# Patient Record
Sex: Female | Born: 1945 | Race: White | Hispanic: No | Marital: Single | Smoking: Former smoker
Health system: Southern US, Academic
[De-identification: ages and names within clinical notes are randomized; demographics above are authoritative.]

## PROBLEM LIST (undated history)

## (undated) DIAGNOSIS — I10 Essential (primary) hypertension: Secondary | ICD-10-CM

## (undated) DIAGNOSIS — E119 Type 2 diabetes mellitus without complications: Secondary | ICD-10-CM

## (undated) DIAGNOSIS — I219 Acute myocardial infarction, unspecified: Secondary | ICD-10-CM

## (undated) DIAGNOSIS — N390 Urinary tract infection, site not specified: Secondary | ICD-10-CM

## (undated) DIAGNOSIS — Z973 Presence of spectacles and contact lenses: Secondary | ICD-10-CM

## (undated) DIAGNOSIS — M109 Gout, unspecified: Secondary | ICD-10-CM

## (undated) DIAGNOSIS — R6889 Other general symptoms and signs: Secondary | ICD-10-CM

## (undated) DIAGNOSIS — R21 Rash and other nonspecific skin eruption: Secondary | ICD-10-CM

## (undated) HISTORY — PX: CAROTID ENDARTERECTOMY: SUR193

## (undated) HISTORY — DX: Gout, unspecified: M10.9

## (undated) HISTORY — PX: HX HYSTERECTOMY: SHX81

## (undated) HISTORY — PX: HX CAROTID ENDARTERECTOMY: SHX38

## (undated) HISTORY — DX: Essential (primary) hypertension: I10

## (undated) HISTORY — PX: HX GALL BLADDER SURGERY/CHOLE: SHX55

## (undated) HISTORY — PX: COLONOSCOPY W/ POLYPECTOMY: SHX1380

## (undated) HISTORY — PX: PARS PLANA VITRECTOMY W/ REPAIR OF MACULAR HOLE: SHX2170

## (undated) HISTORY — DX: Type 2 diabetes mellitus without complications: E11.9

## (undated) HISTORY — PX: AMPUTATION OF REPLICATED TOES: SHX1136

## (undated) HISTORY — PX: HX CATARACT REMOVAL: SHX102

## (undated) HISTORY — PX: HX HERNIA REPAIR: SHX51

## (undated) HISTORY — PX: LUNG REMOVAL, PARTIAL: SHX233

## (undated) HISTORY — PX: HX BREAST BIOPSY: SHX20

---

## 1991-09-11 ENCOUNTER — Ambulatory Visit (HOSPITAL_COMMUNITY): Payer: Self-pay | Admitting: EXTERNAL

## 2019-07-02 ENCOUNTER — Inpatient Hospital Stay (HOSPITAL_COMMUNITY)
Admission: EM | Admit: 2019-07-02 | Discharge: 2019-07-02 | Disposition: A | Discharge status: Home / Self Care | Payer: Medicare HMO | Source: Other Acute Inpatient Hospital

## 2019-07-02 DIAGNOSIS — N3000 Acute cystitis without hematuria: Secondary | ICD-10-CM

## 2019-07-02 DIAGNOSIS — G9341 Metabolic encephalopathy: Secondary | ICD-10-CM

## 2019-07-02 DIAGNOSIS — A419 Sepsis, unspecified organism: Secondary | ICD-10-CM

## 2019-07-02 DIAGNOSIS — I214 Non-ST elevation (NSTEMI) myocardial infarction: Secondary | ICD-10-CM

## 2019-07-03 DIAGNOSIS — J181 Lobar pneumonia, unspecified organism: Secondary | ICD-10-CM

## 2019-07-03 DIAGNOSIS — N390 Urinary tract infection, site not specified: Secondary | ICD-10-CM

## 2021-03-14 LAB — LAB: COLOGUARD RESULT: NEGATIVE

## 2021-03-14 LAB — COLOGUARD® COLON CANCER SCREEN: COLOGUARD RESULT: NEGATIVE

## 2021-07-21 ENCOUNTER — Encounter (INDEPENDENT_AMBULATORY_CARE_PROVIDER_SITE_OTHER): Payer: Self-pay | Admitting: Internal Medicine

## 2021-08-04 ENCOUNTER — Ambulatory Visit (INDEPENDENT_AMBULATORY_CARE_PROVIDER_SITE_OTHER): Payer: Medicare HMO | Admitting: Foot & Ankle Surgery

## 2021-08-04 ENCOUNTER — Other Ambulatory Visit: Payer: Self-pay

## 2021-08-04 ENCOUNTER — Encounter (INDEPENDENT_AMBULATORY_CARE_PROVIDER_SITE_OTHER): Payer: Self-pay | Admitting: Foot & Ankle Surgery

## 2021-08-04 DIAGNOSIS — E1149 Type 2 diabetes mellitus with other diabetic neurological complication: Secondary | ICD-10-CM

## 2021-08-04 DIAGNOSIS — M2041 Other hammer toe(s) (acquired), right foot: Secondary | ICD-10-CM

## 2021-08-04 DIAGNOSIS — Z794 Long term (current) use of insulin: Secondary | ICD-10-CM

## 2021-08-25 ENCOUNTER — Other Ambulatory Visit: Payer: Self-pay

## 2021-08-25 ENCOUNTER — Encounter (INDEPENDENT_AMBULATORY_CARE_PROVIDER_SITE_OTHER): Payer: Self-pay | Admitting: Foot & Ankle Surgery

## 2021-08-25 ENCOUNTER — Ambulatory Visit (INDEPENDENT_AMBULATORY_CARE_PROVIDER_SITE_OTHER): Payer: Medicare HMO | Admitting: Foot & Ankle Surgery

## 2021-08-25 DIAGNOSIS — E1149 Type 2 diabetes mellitus with other diabetic neurological complication: Secondary | ICD-10-CM

## 2021-08-25 DIAGNOSIS — M2041 Other hammer toe(s) (acquired), right foot: Secondary | ICD-10-CM

## 2021-08-25 NOTE — Progress Notes (Signed)
[name], ST. [facility]  [phone] [address]. [place] [address]            Name: Molly Howell MRN:  [id number]   Date: 08/25/2021 Age: 76 y.o.         Chief Complaint:   Chief Complaint   Patient presents with   . Foot Pain     Follow-up right foot-hammertoe       Subjective  Molly Howell is a 76 y.o. female who presents to clinic for follow-up of hammertoe.  She is been wearing padding which she says helps.  She relates no redness no drainage      Objective    On physical examination Molly Howell is seated comfortably in the examination room in no apparent distress.  Alert and oriented to time and place. Mood and affect are normal and appropriate to situation.  She  appears well developed, well nourished, with good attention to hygiene and body habitus.    Skin:  Thin callus dorsal aspect of the right 5th toe.  No ulcer bleed no erythema no drainage.  Musculoskeletal:  Toes 2, 3, 4, 5 on the right contracted at the PIPJ, rigid  Neurological:  Diminished response to pain stimuli right foot  Vascular:  Pedal pulses are intact right.  CFT is immediate to all toes.  Skin temperature is warm to warm proximal to distal right.  No edema right foot or ankle      Assessment/ Plan  Molly Howell was seen today for foot pain.    Diagnoses and all orders for this visit:    Hammer toe of right foot    Type II or unspecified type diabetes mellitus with neurological manifestations, uncontrolled(250.62) (CMS HCC)        1. Today I explained the etiology, prognosis, and treatment options for hammertoes with preulcerative callus right foot.    2. I discussed treatment options including offloading, padding, callus debridement.    3. Today I recommended continue with Silipos padding.  More Silipos padding dispensed today.  Consider radiographs if needed.  The callus today is too thin for debridement.  She recently received diabetic shoes and will likely not be eligible for new shoes until next year.   Consider another order for her.  4. Follow up in 6 or 8 weeks sooner if needed.      Wyn Quaker, DPM        This note was partially created using M*Modal fluency direct system (voice recognition software ) and is inherently subject to errors including those of syntax and "sound- alike" substitutions which may escape proofreading.  In such instances, , original meaning may be extrapolated by contextual derivation.

## 2021-09-21 ENCOUNTER — Other Ambulatory Visit: Payer: Self-pay

## 2021-09-21 ENCOUNTER — Ambulatory Visit (INDEPENDENT_AMBULATORY_CARE_PROVIDER_SITE_OTHER): Payer: Medicare HMO | Admitting: Foot & Ankle Surgery

## 2021-09-21 ENCOUNTER — Encounter (INDEPENDENT_AMBULATORY_CARE_PROVIDER_SITE_OTHER): Payer: Self-pay | Admitting: Foot & Ankle Surgery

## 2021-09-21 DIAGNOSIS — E1149 Type 2 diabetes mellitus with other diabetic neurological complication: Secondary | ICD-10-CM

## 2021-09-21 DIAGNOSIS — M2041 Other hammer toe(s) (acquired), right foot: Secondary | ICD-10-CM

## 2021-09-21 DIAGNOSIS — L97511 Non-pressure chronic ulcer of other part of right foot limited to breakdown of skin: Secondary | ICD-10-CM

## 2021-09-21 NOTE — Progress Notes (Signed)
Molly Howell, ST. [facility]  [phone]. [place] [address]            Name: Molly Howell MRN:  [id number]   Date: 09/21/2021 Age: 76 y.o.         Chief Complaint:   Chief Complaint   Patient presents with    Toe Pain     Follow up wound on small toe R foot        Subjective  Molly Howell is a 76 y.o. female who presents to clinic for follow-up of lesion on her right 5th toe.  The area opened up again.  She is been using a Band-Aid.  He has been wearing diabetic shoes.  She relates no pain      Objective    On physical examination Molly Howell is seated comfortably in the examination room in no apparent distress.  Alert and oriented to time and place. Mood and affect are normal and appropriate to situation.  She  appears well developed, well nourished, with good attention to hygiene and body habitus.    Skin:  Ulceration dorsal aspect of the PIPJ of the right 5th toe.  Callus at the wound periphery.  After debridement the wound measures 0.4 cm in diameter with a 0.1 cm probe depth.  No probe to bone no tracking no undermining no erythema no calor no crepitus no malodor no drainage  Musculoskeletal:  Right 5th toe contracted at the PIPJ, rigid  Neurological:  She does not respond to pain stimuli right foot  Vascular:  Skin temperature is warm to warm proximal to distal right.  CFT is immediate to all toes      Assessment/ Plan  Molly Howell was seen today for toe pain.    Diagnoses and all orders for this visit:    Skin ulcer of toe of right foot, limited to breakdown of skin (CMS HCC)  -     XR TOE, 5TH DIGIT RIGHT; Future    Hammer toe of right foot  -     XR TOE, 5TH DIGIT RIGHT; Future    Type II or unspecified type diabetes mellitus with neurological manifestations, uncontrolled(250.62) (CMS HCC)        1. Today I explained the etiology, prognosis, and treatment options for ulceration right 5th toe, hammertoe deformity.    2. I discussed treatment options including  offloading, wound care.    3. Today I recommended debridement of callus.  She is in agreement.  I removed callus from the wound periphery using a 15. Blade.  Applied a Band-Aid.  Recommend cleaning the wound with warm soapy water daily apply TheraHoney gel and Band-Aid to cover.  She responded well in the past to tube foam padding.  Once the wound is improved we will resort to either foam or Silipos padding for the 5th toe.  Continue with diabetic shoes for now.  I have ordered radiographs of the 5th toe..  4. Follow up in 4 weeks.      Wyn Quaker, DPM        This note was partially created using M*Modal fluency direct system (voice recognition software ) and is inherently subject to errors including those of syntax and "sound- alike" substitutions which may escape proofreading.  In such instances, , original meaning may be extrapolated by contextual derivation.

## 2021-10-05 ENCOUNTER — Ambulatory Visit (INDEPENDENT_AMBULATORY_CARE_PROVIDER_SITE_OTHER): Payer: Self-pay | Admitting: Internal Medicine

## 2021-10-08 ENCOUNTER — Other Ambulatory Visit: Payer: Self-pay

## 2021-10-08 ENCOUNTER — Ambulatory Visit: Payer: Medicare HMO | Attending: Internal Medicine | Admitting: Internal Medicine

## 2021-10-08 ENCOUNTER — Encounter (INDEPENDENT_AMBULATORY_CARE_PROVIDER_SITE_OTHER): Payer: Self-pay | Admitting: Internal Medicine

## 2021-10-08 VITALS — BP 110/50 | HR 77 | Resp 12 | Ht 63.0 in | Wt 137.0 lb

## 2021-10-08 DIAGNOSIS — E1149 Type 2 diabetes mellitus with other diabetic neurological complication: Secondary | ICD-10-CM | POA: Insufficient documentation

## 2021-10-08 DIAGNOSIS — Z794 Long term (current) use of insulin: Secondary | ICD-10-CM | POA: Insufficient documentation

## 2021-10-08 DIAGNOSIS — Z1239 Encounter for other screening for malignant neoplasm of breast: Secondary | ICD-10-CM

## 2021-10-08 DIAGNOSIS — Z87891 Personal history of nicotine dependence: Secondary | ICD-10-CM

## 2021-10-08 DIAGNOSIS — M109 Gout, unspecified: Secondary | ICD-10-CM | POA: Insufficient documentation

## 2021-10-08 DIAGNOSIS — Z0000 Encounter for general adult medical examination without abnormal findings: Secondary | ICD-10-CM | POA: Insufficient documentation

## 2021-10-08 DIAGNOSIS — E559 Vitamin D deficiency, unspecified: Secondary | ICD-10-CM | POA: Insufficient documentation

## 2021-10-08 DIAGNOSIS — E1140 Type 2 diabetes mellitus with diabetic neuropathy, unspecified: Secondary | ICD-10-CM | POA: Insufficient documentation

## 2021-10-08 DIAGNOSIS — I10 Essential (primary) hypertension: Secondary | ICD-10-CM | POA: Insufficient documentation

## 2021-10-08 DIAGNOSIS — I2510 Atherosclerotic heart disease of native coronary artery without angina pectoris: Secondary | ICD-10-CM | POA: Insufficient documentation

## 2021-10-08 DIAGNOSIS — E119 Type 2 diabetes mellitus without complications: Secondary | ICD-10-CM | POA: Insufficient documentation

## 2021-10-08 MED ORDER — COLCHICINE 0.6 MG TABLET
0.6000 mg | ORAL_TABLET | Freq: Every day | ORAL | 0 refills | Status: DC
Start: 2021-10-08 — End: 2022-02-14

## 2021-10-08 MED ORDER — OMEPRAZOLE 40 MG CAPSULE,DELAYED RELEASE
40.0000 mg | DELAYED_RELEASE_CAPSULE | Freq: Every day | ORAL | 1 refills | Status: DC
Start: 2021-10-08 — End: 2022-02-01

## 2021-10-08 NOTE — Progress Notes (Signed)
FAMILY MEDICINE, [facility] [address]  [place] [address]  Phone: [phone]  Fax: [phone]    Encounter Date: 10/08/2021    Patient ID:  Molly Howell  PJS:[id number]    DOB: 04/22/1945  Age: 76 y.o. female    There are no exam notes on file for this visit.  Subjective:     Chief Complaint   Patient presents with    New Patient     Moved from [place].  No new concerns.  Gout right hand.    Medicare Annual     Pt here to estab as a new pt.  She is a Information systems manager well.  She has h/o DM since age 68.  She does have neuropathy.  She initially was placed on metformin but stopped due to diarrhea.  She currently is on toujeo.  Last A1C 8.2 about 3 months ago.  She does see podiatry.  She had an eye exam in May 2023.  She had colonoscopy 3-4 years ago and it was normal. Last mammogram about 1.5 years ago.  Unsure last DEXA, maybe one year ago    Hypertension-   Controlled-yes   Checking BP at home-yes    Gout-she has it in her MCP joints of right hand.  On colchicine during flair and it helps.  She needs a refill.  She takes allopurinol daily        Current Outpatient Medications   Medication Sig    allopurinoL (ZYLOPRIM) 100 mg Oral Tablet Take 1 Tablet (100 mg total) by mouth Once a day    Apple Cider Vinegar 300 mg Oral Tablet Take by mouth    aspirin 325 mg Oral Tablet Take 1 Tablet (325 mg total) by mouth Once a day    atenoloL (TENORMIN) 25 mg Oral Tablet Take 1 Tablet (25 mg total) by mouth Once a day    cholecalciferol, vitamin D3, 25 mcg (1,000 unit) Oral Tablet Take 1 Tablet (1,000 Units total) by mouth Once a day    colchicine, gout, 0.6 mg Oral Tablet Take 1 Tablet (0.6 mg total) by mouth Once a day    cyanocobalamin, vitamin B-12, 5,000 mcg Sublingual Tablet, Sublingual Place under the tongue    insulin glargine U-300 conc (TOUJEO MAX U-300 SOLOSTAR) 300 unit/mL (3 mL) Subcutaneous Insulin Pen Inject 5 Units under the skin Once a day    insulin lispro 100 units/mL Subcutaneous  Injectable Inject 0-12 Units under the skin 5 units at breakfast  10 units at lunch  5 units at dinner    isosorbide mononitrate (IMDUR) 30 mg Oral Tablet Sustained Release 24 hr Take 1 Tablet (30 mg total) by mouth Every morning    lisinopriL (PRINIVIL) 20 mg Oral Tablet Take 1 Tablet (20 mg total) by mouth Once a day    magnesium oxide (MAG-OX) 400 mg Oral Tablet Take 1 Tablet (400 mg total) by mouth Once a day    multivit-minerals/folic acid (MULTIVITAMIN GUMMIES ORAL) Take 1 Tablet by mouth Once a day    omeprazole (PRILOSEC) 40 mg Oral Capsule, Delayed Release(E.C.) Take 1 Capsule (40 mg total) by mouth Once a day    pregabalin (LYRICA) 150 mg Oral Capsule Take 1 Capsule (150 mg total) by mouth Twice daily    rosuvastatin (CRESTOR) 10 mg Oral Tablet Take 1 Tablet (10 mg total) by mouth Every evening    traMADoL (ULTRAM) 50 mg Oral Tablet Take by mouth Every 6 hours as needed for Pain  No Known Allergies  Past Medical History:   Diagnosis Date    Diabetes mellitus, type 2 (CMS HCC)     Gout, unspecified     Hypertension          Social History     Tobacco Use    Smoking status: Former     Packs/day: 1.00     Types: Cigarettes    Smokeless tobacco: Never   Vaping Use    Vaping Use: Never used   Substance Use Topics    Alcohol use: Never    Drug use: Never         Review of Systems   Musculoskeletal:  Positive for arthralgias.   All other systems reviewed and are negative.  Objective:    Vitals: BP (!) 110/50 (Site: Left, Patient Position: Sitting, Cuff Size: Adult)   Pulse 77   Resp 12   Ht 1.6 m ('5\' 3"' )   Wt 62.1 kg (137 lb)   BMI 24.27 kg/m           Physical Exam  Vitals and nursing note reviewed.   Constitutional:       General: She is not in acute distress.  HENT:      Head: Normocephalic.      Right Ear: Tympanic membrane normal.      Left Ear: Tympanic membrane normal.      Nose: Nose normal.      Mouth/Throat:      Pharynx: Oropharynx is clear.      Comments: Upper denture, lower  adentulous  Eyes:      Pupils: Pupils are equal, round, and reactive to light.      Comments: glasses   Cardiovascular:      Rate and Rhythm: Normal rate and regular rhythm.      Pulses: Normal pulses.   Pulmonary:      Effort: Pulmonary effort is normal.      Breath sounds: Normal breath sounds.   Abdominal:      General: Bowel sounds are normal.   Musculoskeletal:      Comments: Left foot-amputation mid foot  Right great toe amputated  Decreased sensation b/l feet   Skin:     General: Skin is warm and dry.   Neurological:      General: No focal deficit present.      Mental Status: She is alert and oriented to person, place, and time.   Psychiatric:         Mood and Affect: Mood normal.       No results found for this or any previous visit (from the past 9233 hour(s)).    Assessment & Plan:     ENCOUNTER DIAGNOSES     ICD-10-CM   1. Medicare annual wellness visit, subsequent  Z00.00   2. Hypertension, unspecified type  I10   3. Type 2 diabetes mellitus with diabetic neuropathy, with long-term current use of insulin (CMS HCC)  E11.40    Z79.4   4. Gout, unspecified cause, unspecified chronicity, unspecified site  M10.9   5. Coronary artery disease involving native coronary artery of native heart without angina pectoris  I25.10   6. Vitamin D deficiency  E55.9   7. Encounter for screening for malignant neoplasm of breast, unspecified screening modality  Z12.39      Continue allopurinol and colchicine as needed for gout  Continue toujeo and lispro for DM  Continue lisinopril for HTN and renal protection  Continue atenolol for HTN  Continue Imdur for CAD  Continue vitamin D and check level for D def  Continue Lyrica, tramadol BID for DM neuropathy  Mammogram ordered  Continue Crestor for lipids  Labs as ordered  Orders Placed This Encounter    MAMMO BILATERAL SCREENING-ADDL VIEWS/BREAST US AS REQ BY RAD    COMPREHENSIVE METABOLIC PNL, FASTING    LIPID PANEL    MICROALBUMIN/CREATININE RATIO, URINE, RANDOM    URINALYSIS  WITH REFLEX MICROSCOPIC AND CULTURE IF POSITIVE    HGA1C (HEMOGLOBIN A1C WITH EST AVG GLUCOSE)    VITAMIN D 25 TOTAL    URIC ACID    Referral to OPHTHALMOLOGY-[facility]    omeprazole (PRILOSEC) 40 mg Oral Capsule, Delayed Release(E.C.)    colchicine, gout, 0.6 mg Oral Tablet                    Return in about 3 months (around 01/07/2022) for In Person Visit.      Serita Grammes, DO  10/08/2021, 11:46FAMILY MEDICINE, [address]  [place] [id number]  Operated by [facility]  Medicare Annual Wellness Visit    Name: Molly Howell MRN:  [id number]   Date: 10/08/2021 Age: 76 y.o.       SUBJECTIVE:   Morrison Masser is a 76 y.o. female for presenting for Medicare Wellness exam.   I have reviewed and reconciled the medication list with the patient today.        10/08/2021    11:38 AM   Comprehensive Health Assessment-Adult   Do you wish to complete this form? Yes   During the past 4 weeks, how would you rate your health in general? Good   During the past 4 weeks, how much difficulty have you had doing your usual activities inside and outside your home because of medical or emotional problems? No difficulty at all   During the past 4 weeks, was someone available to help you if you needed and wanted help? Yes, as much as I wanted   In the past year, how many times have you gone to the emergency department or been admitted to a hospital for a health problem? None   Are you generally satisfied with your sleep? Yes   Do you have enough money to buy things you need in everyday life, such as food, clothing, medicines, and housing? Yes, always   Can you get to places beyond walking distance without help?  (For example, can you drive your own car or travel alone on buses)? Yes   Do you fasten your seatbelt when you are in a car? Yes, usually   Do you exercise 20 minutes 3 or more days per week (such as walking, dancing, biking, mowing grass, swimming)? No, I usually don't  exercise this much   How often do you eat food that is healthy (fruits, vegetables, lean meats) instead of unhealthy (sweets, fast food, junk food, fatty foods)? Some of the time   Have your parents, brothers or sisters had any of the following problems before the age of 69? (check all that apply) Heart problems, or hardening of the arteries;Diabetes (sugar);High cholesterol;Mental health problems such as depression, bipolar, severe anxiety, postpartum depression;Alcohol or drug addiction (or abuse)   How often do you have trouble taking medicines the eay you are told to take them? I always take them as prescribed   Do you need any help communicating with your doctors and nurses because of vision or  hearing problems? No   During the past 12 months, have you experienced confusion or memory loss that is happening more often or is getting worse? No   Do you have one person you think of as your personal doctor (primary care provider or family doctor)? Yes   If you are seeing a Primary Care Provider (PCP) or family doctor. please list their name Dr. Wynne Dust DO   How confident are you that you can control or manage most of your health problems? Very confident         I have reviewed and updated as appropriate the past medical, family and social history. 10/08/2021 as summarized below:  Past Medical History:   Diagnosis Date    Diabetes mellitus, type 2 (CMS HCC)     Gout, unspecified     Hypertension      Past Surgical History:   Procedure Laterality Date    Amputation of replicated toes      Carotid endarterectomy      Hx cataract removal      Hx cholecystectomy      Hx hernia repair      Hx hysterectomy       Current Outpatient Medications   Medication Sig    allopurinoL (ZYLOPRIM) 100 mg Oral Tablet Take 1 Tablet (100 mg total) by mouth Once a day    Apple Cider Vinegar 300 mg Oral Tablet Take by mouth    aspirin 325 mg Oral Tablet Take 1 Tablet (325 mg total) by mouth Once a day    atenoloL (TENORMIN) 25 mg Oral  Tablet Take 1 Tablet (25 mg total) by mouth Once a day    cholecalciferol, vitamin D3, 25 mcg (1,000 unit) Oral Tablet Take 1 Tablet (1,000 Units total) by mouth Once a day    colchicine, gout, 0.6 mg Oral Tablet Take 1 Tablet (0.6 mg total) by mouth Once a day    cyanocobalamin, vitamin B-12, 5,000 mcg Sublingual Tablet, Sublingual Place under the tongue    insulin glargine U-300 conc (TOUJEO MAX U-300 SOLOSTAR) 300 unit/mL (3 mL) Subcutaneous Insulin Pen Inject 5 Units under the skin Once a day    insulin lispro 100 units/mL Subcutaneous Injectable Inject 0-12 Units under the skin 5 units at breakfast  10 units at lunch  5 units at dinner    isosorbide mononitrate (IMDUR) 30 mg Oral Tablet Sustained Release 24 hr Take 1 Tablet (30 mg total) by mouth Every morning    lisinopriL (PRINIVIL) 20 mg Oral Tablet Take 1 Tablet (20 mg total) by mouth Once a day    magnesium oxide (MAG-OX) 400 mg Oral Tablet Take 1 Tablet (400 mg total) by mouth Once a day    multivit-minerals/folic acid (MULTIVITAMIN GUMMIES ORAL) Take 1 Tablet by mouth Once a day    omeprazole (PRILOSEC) 40 mg Oral Capsule, Delayed Release(E.C.) Take 1 Capsule (40 mg total) by mouth Once a day    pregabalin (LYRICA) 150 mg Oral Capsule Take 1 Capsule (150 mg total) by mouth Twice daily    rosuvastatin (CRESTOR) 10 mg Oral Tablet Take 1 Tablet (10 mg total) by mouth Every evening    traMADoL (ULTRAM) 50 mg Oral Tablet Take by mouth Every 6 hours as needed for Pain     Family Medical History:       Problem Relation (Age of Onset)    Coronary Artery Disease Mother    Diabetes Mother    Hypertension (High Blood Pressure) Mother  Lung disease Father            Social History     Socioeconomic History    Marital status: Divorced   Tobacco Use    Smoking status: Former     Packs/day: 1.00     Types: Cigarettes    Smokeless tobacco: Never   Vaping Use    Vaping Use: Never used   Substance and Sexual Activity    Alcohol use: Never    Drug use: Never     Social  Determinants of Company secretary Strain: Low Risk     SDOH Financial: No   Transportation Needs: Low Risk     SDOH Transportation: No   Social Connections: Low Risk     SDOH Social Isolation: 5 or more times a week   Intimate Partner Violence: Low Risk     SDOH Domestic Violence: No   Housing Stability: [place] Situation: I have housing.    SDOH Housing Worry: No   Health Literacy: Low Risk     SDOH Health Literacy: Never   Employment Status: Low Risk     SDOH Employment: Otherwise unemployed but not seeking work (ex. Ship broker, retired, disabled, unpaid primary care giver)         List of [place] Team       PCP       Name Type Specialty Phone Number    Serita Grammes, DO Physician INTERNAL MEDICINE [phone]              Care Team       No care team found                      Health Maintenance   Topic Date Due    Diabetic Retinal Exam  Never done    Osteoporosis screening  Never done    Hepatitis C screening  Never done    Diabetic A1C  Never done    Covid-19 Vaccine (1) Never done    Pneumococcal Vaccination, Age 35+ (1 - PCV) Never done    Diabetic Kidney Health eGFR  Never done    Diabetic Kidney Health Microalb/Cr Ratio  Never done    Adult Tdap-Td (1 - Tdap) Never done    Shingles Vaccine (1 of 2) Never done    Influenza Vaccine (1) Never done    Depression Screening  10/09/2022    Annual Wellness Visit  10/09/2022    Meningococcal Vaccine  Aged Out     Medicare Wellness Assessment   Medicare initial or wellness physical in the last year?: No  Advance Directives   Does patient have a living will or MPOA: no   Has patient provided [employer] with a copy?: no   Advance directive information given to the patient today?: no      Activities of Daily Living   Do you need help with dressing, bathing, or walking?: No   Do you need help with shopping, housekeeping, medications, or finances?: No   Do you have rugs in hallways, broken steps, or poor  lighting?: No   Do you have grab bars in your bathroom, non-slip strips in your tub, and hand rails on your stairs?: Yes   Urinary Incontinence Screen   Do you ever leak urine when you don't want to?: YES   Cognitive Function Screen (1=Yes, 0=No)   What  is you age?: Correct   What is the time to the nearest hour?: Correct   What is the year?: Correct   What is the name of this clinic?: Correct   Can the patient recognize two persons (the doctor, the nurse, home help, etc.)?: Correct   What is the date of your birth? (day and month sufficient) : Correct   In what year did World War II end?: Correct   Who is the current president of the [place]?: Correct   Count from 20 down to 1?: Correct   What address did I give you earlier?: Incorrect   Total Score: 9   Interpretation of Total Score: Greater than 6 Normal   Hearing Screen   Have you noticed any hearing difficulties?: No   Fall Risk Screen   Do you feel unsteady when standing or walking?: Yes  Do you worry about falling?: Yes  Have you fallen in the past year?: Yes  How many times have you fallen?: Once  Were you ever injured from falling?: No   Vision Screen   Right Eye = 20: 25   Left Eye = 20: 25   Depression Screen     Little interest or pleasure in doing things.: Not at all  Feeling down, depressed, or hopeless: Not at all  PHQ 2 Total: 0     Pain Score   Pain Score:   0 - No pain    Substance Use-Abuse Screening     Tobacco Use     In Past 12 MONTHS, how often have you used any tobacco product (for example, cigarettes, e-cigarettes, cigars, pipes, or smokeless tobacco)?: Never     Alcohol use     In the PAST 12 MONTHS, how often have you had 5 (men)/4 (women) or more drinks containing alcohol in one day?: Never     Prescription Drug Use     In the PAST 12 months, how often have you used any prescription medications just for the feeling, more than prescribed, or that were not prescribed for you? Prescriptions may include: opioids, benzodiazepines,  medications for ADHD: Never           Illicit Drug Use   In the PAST 12 MONTHS, how often have you used any drugs, including marijuana, cocaine or crack, heroin, methamphetamine, hallucinogens, ecstasy/MDMA?: Never           OBJECTIVE:   BP (!) 110/50 (Site: Left, Patient Position: Sitting, Cuff Size: Adult)   Pulse 77   Resp 12   Ht 1.6 m ('5\' 3"' )   Wt 62.1 kg (137 lb)   BMI 24.27 kg/m        Other appropriate exam:    Health Maintenance Due   Topic Date Due    Diabetic Retinal Exam  Never done    Osteoporosis screening  Never done    Hepatitis C screening  Never done    Diabetic A1C  Never done    Covid-19 Vaccine (1) Never done    Pneumococcal Vaccination, Age 61+ (1 - PCV) Never done    Diabetic Kidney Health eGFR  Never done    Diabetic Kidney Health Microalb/Cr Ratio  Never done    Adult Tdap-Td (1 - Tdap) Never done    Shingles Vaccine (1 of 2) Never done    Influenza Vaccine (1) Never done      ASSESSMENT & PLAN:   Assessment/Plan   1. Medicare annual wellness visit, subsequent    2.  Hypertension, unspecified type    3. Type 2 diabetes mellitus with diabetic neuropathy, with long-term current use of insulin (CMS HCC)    4. Gout, unspecified cause, unspecified chronicity, unspecified site    5. Coronary artery disease involving native coronary artery of native heart without angina pectoris    6. Vitamin D deficiency    7. Encounter for screening for malignant neoplasm of breast, unspecified screening modality       Identified Risk Factors/ Recommended Actions     Fall Risk Follow up plan of care: Footwear and potential problems addressed  The PHQ 2 Total: 0 depression screen is interpreted as negative.  Urinary Incontinence Plan of Care: Addressed comorbid conditions   Opioid use plan of care:         Opioids use: Plan: Assessment of pain completed and pain controlled        Orders Placed This Encounter    MAMMO BILATERAL SCREENING-ADDL VIEWS/BREAST US AS REQ BY RAD    COMPREHENSIVE METABOLIC PNL,  FASTING    LIPID PANEL    MICROALBUMIN/CREATININE RATIO, URINE, RANDOM    URINALYSIS WITH REFLEX MICROSCOPIC AND CULTURE IF POSITIVE    HGA1C (HEMOGLOBIN A1C WITH EST AVG GLUCOSE)    VITAMIN D 25 TOTAL    URIC ACID    Referral to OPHTHALMOLOGY-[facility]    omeprazole (PRILOSEC) 40 mg Oral Capsule, Delayed Release(E.C.)    colchicine, gout, 0.6 mg Oral Tablet          The patient has been educated about risk factors and recommended preventive care. Written Prevention Plan completed/ updated and given to patient (see After Visit Summary).    Return in about 3 months (around 01/07/2022) for In Person Visit.    Serita Grammes, DO

## 2021-10-08 NOTE — Patient Instructions (Addendum)
Medicare Preventive Services  Medicare coverage information Recommendation for YOU   Heart Disease and Diabetes   Lipid profile Every 5 years or more often if at risk for cardiovascular disease     Diabetes Screening  yearly for those at risk for diabetes, 2 tests per year for those with prediabetes Last Glucose:      Diabetes Self Management Training or Medical Nutrition Therapy  For those with diabetes, up to 10 hrs initial training within a year, subsequent years up to 2 hrs of follow up training Optional for those with diabetes     Medical Nutrition Therapy Three hours of one-on-one counseling in first year, two hours in subsequent years Optional for those with diabetes, kidney disease   Intensive Behavioral Therapy for Obesity  Face-to-face counseling, first month every week, month 2-6 every other week, month 7-12 every month if continued progress is documented Optional for those with Body Mass Index 30 or higher  Your Body mass index is 24.27 kg/m.   Tobacco Cessation (Quitting) Counseling   Two attempts per year, max 4 sessions per attempt, up to 8 per year, for those with tobacco-related health condition Optional for those that use tobacco   Cancer Screening   Colorectal screening   For anyone age 15 to 80 or any age if high risk:  Screening Colonoscopy every 10 yrs if low risk,  more frequent if higher risk  OR  Cologuard Stool DNA test once every 3 years OR  Fecal Occult Blood Testing yearly OR  Flexible  Sigmoidoscopy  every 5 yr OR  CT Colonography every 5 yrs    See your schedule below   Screening Pap Test Recommended every 3 years for all women age 61 to 71, or every five years if combined with HPV test (routine screening not needed after total hysterectomy).  Medicare covers every 2 years, up to yearly if high risk.  Screening Pelvic Exam Medicare covers every 2 years, yearly if high risk or childbearing age with abnormal Pap in last 3 yrs. See your schedule below   Screening Mammogram   Recommended  every 2 years for women age 28 to 13, or more frequent if you have a higher risk. Selectively recommended for women between 40-49 based on shared decisions about risk. Covered by Medicare up to every year for women age 58 or older See your schedule below   Lung Cancer Screening  Annual low dose computed tomography (LDCT scan) is recommended for those age 4-77 who smoked 20 pack-years and are current smokers or quit smoking within past 15 years (one pack-year= smoking one PPD for one year), after counseling by your doctor or nurse clinician about the possible benefits or harms. See your schedule below   Vaccinations   Pneumococcal Vaccine: Recommended routinely age 35+ with one or two separate vaccines based on your risk    Recommended before age 72 if medical conditions with increased risk  Seasonal Influenza Vaccine: Once every flu season   Hepatitis B Vaccine: 3 doses if risk (including anyone with diabetes or liver disease)  Shingles Vaccine: Two doses at age 1 or older  Diphtheria Tetanus Pertussis Vaccine: ONCE as adult, booster every 10 years     There is no immunization history on file for this patient.  Shingles vaccine and Diphtheria Tetanus Pertussis vaccines are available at pharmacies or local health department without a prescription.   Other Screening   Bone Densitometry   Every 24 months for anyone at risk, including  postmenopausal       Glaucoma Screening   Yearly if in high risk group such as diabetes, family history, African American age 21+ or Hispanic American age 78+      Hepatitis C Screening recommended ONCE for those born between 1945-1965, or high risk for HCV infection       HIV Testing recommended routinely at least ONCE, covered every year for age 54 to 58 regardless of risk, and every year for age over 23 who ask for the test or higher risk  Yearly or up to 3 times in pregnancy         Abdominal Aortic Aneurysm Screening Ultrasound   Once between the age of 22-75 with a family history  of AAA       Your Personalized Schedule for Preventive Tests     Health Maintenance: Pending and Last Completed         Date Due Completion Date    Diabetic Retinal Exam Never done ---    Osteoporosis screening Never done ---    Hepatitis C screening Never done ---    Diabetic A1C Never done ---    Covid-19 Vaccine (1) Never done ---    Pneumococcal Vaccination, Age 34+ (1 - PCV) Never done ---    Diabetic Kidney Health eGFR Never done ---    Diabetic Kidney Health Microalb/Cr Ratio Never done ---    Adult Tdap-Td (1 - Tdap) Never done ---    Shingles Vaccine (1 of 2) Never done ---    Annual Wellness Visit Never done ---    Influenza Vaccine (1) Never done ---    Depression Screening 10/09/2022 10/08/2021               Non-Opioid Treatment for Chronic Pains     Treatment for chronic pain can be managed without opioids. Below are non-opioid options that may be considered and discussed with your provider to determine which options would be best for your health.    Over the counter or presciptions medications:  Acetaminophen (Tylenol) or Non-steroidal anti-inflammatories such as: Ibuprofen (Motrin, Advil), naproxen (Aleve), aspirin  Antidepressants such as amitriptyline, nortriptyline (Pamelor),  Doxepin (Silenor), Imipramine (Tofranil) and others.  Anticonvulsant Nerve pain medications: Gabapentin (Neurontin), pregabalin (Lyrica)  Externally applied medications such as NSAID'S, lidocaine, capsaisin, and others  Injections: pain specialists can sometimes inject medications at the site of pain.    Alternative therapies such as  Acupuncture  Osteopathic manipulation  Chiropractic  Massage therapy     If you do not hear back from our office in 7-10 business days regarding your referral please call to check on it   Continue current medications   Labs as ordered  Follow up with specialists as scheduled.

## 2021-10-27 ENCOUNTER — Encounter (INDEPENDENT_AMBULATORY_CARE_PROVIDER_SITE_OTHER): Payer: Self-pay | Admitting: Foot & Ankle Surgery

## 2021-11-02 ENCOUNTER — Encounter (HOSPITAL_COMMUNITY): Payer: Self-pay

## 2021-11-02 ENCOUNTER — Other Ambulatory Visit: Payer: Self-pay

## 2021-11-02 ENCOUNTER — Inpatient Hospital Stay
Admission: RE | Admit: 2021-11-02 | Discharge: 2021-11-02 | Disposition: A | Discharge status: Home / Self Care | Payer: Medicare HMO | Source: Ambulatory Visit | Attending: Internal Medicine | Admitting: Internal Medicine

## 2021-11-02 DIAGNOSIS — Z1231 Encounter for screening mammogram for malignant neoplasm of breast: Secondary | ICD-10-CM | POA: Insufficient documentation

## 2021-11-02 DIAGNOSIS — Z1239 Encounter for other screening for malignant neoplasm of breast: Secondary | ICD-10-CM

## 2021-11-08 ENCOUNTER — Other Ambulatory Visit (INDEPENDENT_AMBULATORY_CARE_PROVIDER_SITE_OTHER): Payer: Self-pay | Admitting: Internal Medicine

## 2021-11-08 MED ORDER — TRAMADOL 50 MG TABLET
1.0000 | ORAL_TABLET | Freq: Four times a day (QID) | ORAL | 1 refills | Status: DC | PRN
Start: 2021-11-08 — End: 2022-01-11

## 2021-11-10 ENCOUNTER — Ambulatory Visit (INDEPENDENT_AMBULATORY_CARE_PROVIDER_SITE_OTHER): Payer: Medicare HMO | Admitting: Foot & Ankle Surgery

## 2021-11-10 ENCOUNTER — Inpatient Hospital Stay (INDEPENDENT_AMBULATORY_CARE_PROVIDER_SITE_OTHER)
Admission: RE | Admit: 2021-11-10 | Discharge: 2021-11-10 | Disposition: A | Discharge status: Home / Self Care | Payer: Medicare HMO | Source: Ambulatory Visit | Attending: Foot & Ankle Surgery | Admitting: Foot & Ankle Surgery

## 2021-11-10 ENCOUNTER — Other Ambulatory Visit: Payer: Self-pay

## 2021-11-10 ENCOUNTER — Encounter (INDEPENDENT_AMBULATORY_CARE_PROVIDER_SITE_OTHER): Payer: Self-pay | Admitting: Foot & Ankle Surgery

## 2021-11-10 DIAGNOSIS — M2041 Other hammer toe(s) (acquired), right foot: Secondary | ICD-10-CM

## 2021-11-10 DIAGNOSIS — L97511 Non-pressure chronic ulcer of other part of right foot limited to breakdown of skin: Secondary | ICD-10-CM

## 2021-11-10 DIAGNOSIS — E1149 Type 2 diabetes mellitus with other diabetic neurological complication: Secondary | ICD-10-CM

## 2021-11-10 DIAGNOSIS — M7989 Other specified soft tissue disorders: Secondary | ICD-10-CM

## 2021-11-10 DIAGNOSIS — R937 Abnormal findings on diagnostic imaging of other parts of musculoskeletal system: Secondary | ICD-10-CM

## 2021-11-10 NOTE — Progress Notes (Signed)
Molly Howell, [place]  [place] [address]            Name: Molly Howell MRN:  [id number]   Date: 11/10/2021 Age: 76 y.o.         Chief Complaint:   Chief Complaint   Patient presents with    Foot Ulcer     Patient is present in office for right foot ulcer.        Subjective  Molly Howell is a 76 y.o. female who presents to clinic for follow-up of lesion on her right 5th toe.  She is been using TheraHoney gel keeping covered with a Band-Aid.  Wearing diabetic shoes.  No pain no redness no drainage no swelling.      Objective    On physical examination Molly Howell is seated comfortably in the examination room in no apparent distress.  Alert and oriented to time and place. Mood and affect are normal and appropriate to situation.  She  appears well developed, well nourished, with good attention to hygiene and body habitus.    Skin:  Ulceration dorsal aspect of the PIPJ of the right 5th toe.  No callus at the wound periphery.  Wound measures 0.3 cm in diameter with no probe depth.  Wound bed is red granulation tissue no erythema no drainage no malodor no calor no crepitus  Musculoskeletal:  Right 5th toe contracted at the PIPJ, rigid  Neurological:  She does not respond to pain stimuli right foot  Vascular:  Skin temperature is warm to warm proximal to distal right.  CFT is immediate to all toes      Assessment/ Plan  Molly Howell was seen today for foot ulcer.    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 Today I explained the etiology, prognosis, and treatment options for ulceration right 5th toe, hammertoe deformity.    2. I discussed treatment options including offloading, wound care.    3. Recommend cleaning the wound with warm soapy water  daily apply TheraHoney gel and Band-Aid to cover.  She responded well in the past to tube foam padding.  Once the wound is resolved we will resort to either foam or Silipos padding for the 5th toe.  Continue with diabetic shoes for now.  Consider radiographs of the 5th toe pending her continued improvement   4. Follow up in 4 to 6 weeks.      Molly Howell, DPM        This note was partially created using M*Modal fluency direct system (voice recognition software ) and is inherently subject to errors including those of syntax and "sound- alike" substitutions which may escape proofreading.  In such instances, , original meaning may be extrapolated by contextual derivation.

## 2021-12-11 ENCOUNTER — Other Ambulatory Visit: Payer: Medicare HMO | Attending: Internal Medicine

## 2021-12-11 ENCOUNTER — Other Ambulatory Visit: Payer: Self-pay

## 2021-12-11 DIAGNOSIS — E1140 Type 2 diabetes mellitus with diabetic neuropathy, unspecified: Secondary | ICD-10-CM | POA: Insufficient documentation

## 2021-12-11 DIAGNOSIS — Z794 Long term (current) use of insulin: Secondary | ICD-10-CM | POA: Insufficient documentation

## 2021-12-11 DIAGNOSIS — I10 Essential (primary) hypertension: Secondary | ICD-10-CM | POA: Insufficient documentation

## 2021-12-11 DIAGNOSIS — I2510 Atherosclerotic heart disease of native coronary artery without angina pectoris: Secondary | ICD-10-CM | POA: Insufficient documentation

## 2021-12-11 DIAGNOSIS — M109 Gout, unspecified: Secondary | ICD-10-CM | POA: Insufficient documentation

## 2021-12-11 DIAGNOSIS — E559 Vitamin D deficiency, unspecified: Secondary | ICD-10-CM | POA: Insufficient documentation

## 2021-12-11 LAB — COMPREHENSIVE METABOLIC PNL, FASTING
ALBUMIN/GLOBULIN RATIO: 1.3 — ABNORMAL LOW (ref 1.5–2.5)
ALBUMIN: 4.2 g/dL (ref 3.5–5.0)
ALKALINE PHOSPHATASE: 128 U/L — ABNORMAL HIGH (ref 38–126)
ALT (SGPT): 24 U/L (ref <35)
ANION GAP: 13 mmol/L (ref 5–19)
AST (SGOT): 64 U/L — ABNORMAL HIGH (ref 14–36)
BILIRUBIN TOTAL: 0.5 mg/dL (ref 0.2–1.3)
BUN/CREA RATIO: 22 — ABNORMAL HIGH (ref 6–20)
BUN: 24 mg/dL — ABNORMAL HIGH (ref 7–17)
CALCIUM: 9.8 mg/dL (ref 8.4–10.2)
CHLORIDE: 107 mmol/L (ref 98–107)
CO2 TOTAL: 19 mmol/L — ABNORMAL LOW (ref 22–30)
CREATININE: 1.08 mg/dL — ABNORMAL HIGH (ref 0.52–1.00)
ESTIMATED GFR: 53 mL/min/{1.73_m2} — ABNORMAL LOW (ref >60)
GLUCOSE: 121 mg/dL — ABNORMAL HIGH (ref 74–106)
POTASSIUM: 5 mmol/L (ref 3.5–5.1)
PROTEIN TOTAL: 7.4 g/dL (ref 6.3–8.2)
SODIUM: 139 mmol/L (ref 137–145)

## 2021-12-11 LAB — URINALYSIS, MACRO/MICRO
BILIRUBIN: NEGATIVE mg/dL
BLOOD: NEGATIVE mg/dL
GLUCOSE: NEGATIVE mg/dL
KETONES: NEGATIVE mg/dL
NITRITE: POSITIVE — AB
PH: 6 (ref 5.0–9.0)
PROTEIN: 30 mg/dL — AB
SPECIFIC GRAVITY: 1.011 (ref 1.003–1.035)
UROBILINOGEN: <2 mg/dL (ref <2.0)

## 2021-12-11 LAB — HGA1C (HEMOGLOBIN A1C WITH EST AVG GLUCOSE)
ESTIMATED AVERAGE GLUCOSE: 177 mg/dL
HEMOGLOBIN A1C: 7.8 % — ABNORMAL HIGH (ref 4.0–6.0)

## 2021-12-11 LAB — MICROALBUMIN/CREATININE RATIO, URINE, RANDOM
CREATININE RANDOM URINE: 55 mg/dL
MICROALBUMIN RANDOM URINE: 15.1 mg/dL — ABNORMAL HIGH (ref 0.0–1.7)
MICROALBUMIN/CREATININE RATIO RANDOM URINE: 274.5 mg/g — ABNORMAL HIGH (ref 0.0–29.9)

## 2021-12-11 LAB — VITAMIN D 25 TOTAL: VITAMIN D: 54 ng/mL (ref 30–100)

## 2021-12-11 LAB — LIPID PANEL
CHOLESTEROL: 88 mg/dL (ref 0–200)
HDL CHOL: 33 mg/dL — ABNORMAL LOW (ref >40)
LDL CALC: 20 mg/dL (ref <100)
TRIGLYCERIDES: 175 mg/dL — ABNORMAL HIGH (ref <150)

## 2021-12-11 LAB — URIC ACID: URIC ACID: 7 mg/dL — ABNORMAL HIGH (ref 2.5–6.2)

## 2021-12-11 LAB — MICRO HOLD

## 2021-12-13 LAB — URINE CULTURE,ROUTINE: URINE CULTURE: >100000 — AB

## 2021-12-15 ENCOUNTER — Other Ambulatory Visit (INDEPENDENT_AMBULATORY_CARE_PROVIDER_SITE_OTHER): Payer: Self-pay | Admitting: Internal Medicine

## 2021-12-15 DIAGNOSIS — N3000 Acute cystitis without hematuria: Secondary | ICD-10-CM

## 2021-12-15 MED ORDER — NITROFURANTOIN MONOHYDRATE/MACROCRYSTALS 100 MG CAPSULE
100.0000 mg | ORAL_CAPSULE | Freq: Two times a day (BID) | ORAL | 0 refills | Status: DC
Start: 2021-12-15 — End: 2022-01-11

## 2021-12-22 ENCOUNTER — Ambulatory Visit (INDEPENDENT_AMBULATORY_CARE_PROVIDER_SITE_OTHER): Payer: Medicare HMO | Admitting: Foot & Ankle Surgery

## 2021-12-22 ENCOUNTER — Other Ambulatory Visit: Payer: Self-pay

## 2021-12-22 ENCOUNTER — Encounter (INDEPENDENT_AMBULATORY_CARE_PROVIDER_SITE_OTHER): Payer: Self-pay | Admitting: Foot & Ankle Surgery

## 2021-12-22 DIAGNOSIS — L97511 Non-pressure chronic ulcer of other part of right foot limited to breakdown of skin: Secondary | ICD-10-CM

## 2021-12-22 DIAGNOSIS — M2041 Other hammer toe(s) (acquired), right foot: Secondary | ICD-10-CM

## 2021-12-22 DIAGNOSIS — E1149 Type 2 diabetes mellitus with other diabetic neurological complication: Secondary | ICD-10-CM

## 2021-12-22 NOTE — Progress Notes (Signed)
[name], [facility]  [phone] [place] [address]            Name: Molly Howell MRN:  [id number]   Date: 12/22/2021 Age: 76 y.o.         Chief Complaint:   Chief Complaint   Patient presents with    Foot Pain    Foot Ulcer     Patient is here to follow up with the R foot ulcer today.        Subjective  Molly Howell is a 76 y.o. female who presents to clinic for follow-up of lesion on her right 5th toe.  She is been using TheraHoney gel keeping covered with a Band-Aid.  Wearing diabetic shoes.  No pain no redness no drainage no swelling.      Objective    On physical examination Molly Howell is seated comfortably in the examination room in no apparent distress.  Alert and oriented to time and place. Mood and affect are normal and appropriate to situation.  She  appears well developed, well nourished, with good attention to hygiene and body habitus.    Skin:  Ulceration dorsal aspect of the PIPJ of the right 5th toe.  Thick callus.  With debridement of the callus there is a small underlying ulceration measuring 0.2 cm in diameter with a 0.1 cm probe depth.  Scant serous drainage from the wound after debridement.  There is no probe to bone no tracking no undermining.  No erythema no calor no crepitus no malodor.   Musculoskeletal:  Right 5th toe contracted at the PIPJ, rigid  Neurological:  She does not respond to pain stimuli right foot  Vascular:  Skin temperature is warm to warm proximal to distal right.  CFT is immediate to all toes      Assessment/ Plan  Molly Howell was seen today for foot pain and foot ulcer.    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 Today I explained the etiology, prognosis, and treatment options for ulceration right 5th  toe, hammertoe deformity.    2. I discussed treatment options including offloading, wound care.    3. Recommend cleaning the wound with warm soapy water daily apply TheraHoney gel and Band-Aid to cover.  I debrided callus from the wound periphery today right 5th toe using a 15. Blade.  Applied a Band-Aid today.  She responded well in the past to tube foam padding.  Once the wound is resolved we will resort to either foam or Silipos padding for the 5th toe.  Continue with diabetic shoes for now.  Consider radiographs of the 5th toe pending her continued improvement   4. Follow up in 2 months sooner if needed.      Wyn Quaker, DPM        This note was partially created using M*Modal fluency direct system (voice recognition software ) and is inherently subject to errors including those of syntax and "sound- alike" substitutions which may escape proofreading.  In such instances, , original meaning may be extrapolated by contextual derivation.

## 2021-12-23 ENCOUNTER — Ambulatory Visit (INDEPENDENT_AMBULATORY_CARE_PROVIDER_SITE_OTHER): Payer: Self-pay | Admitting: Internal Medicine

## 2021-12-28 ENCOUNTER — Other Ambulatory Visit (INDEPENDENT_AMBULATORY_CARE_PROVIDER_SITE_OTHER): Payer: Self-pay | Admitting: Internal Medicine

## 2021-12-28 MED ORDER — PREGABALIN 150 MG CAPSULE
150.0000 mg | ORAL_CAPSULE | Freq: Two times a day (BID) | ORAL | 0 refills | Status: DC
Start: 2021-12-28 — End: 2022-03-21

## 2022-01-11 ENCOUNTER — Other Ambulatory Visit: Payer: Self-pay

## 2022-01-11 ENCOUNTER — Ambulatory Visit: Payer: Medicare HMO | Attending: Internal Medicine | Admitting: Internal Medicine

## 2022-01-11 ENCOUNTER — Encounter (INDEPENDENT_AMBULATORY_CARE_PROVIDER_SITE_OTHER): Payer: Self-pay | Admitting: Internal Medicine

## 2022-01-11 VITALS — BP 130/60 | HR 78 | Resp 14 | Ht 63.0 in | Wt 138.0 lb

## 2022-01-11 DIAGNOSIS — E119 Type 2 diabetes mellitus without complications: Secondary | ICD-10-CM | POA: Insufficient documentation

## 2022-01-11 DIAGNOSIS — M109 Gout, unspecified: Secondary | ICD-10-CM | POA: Insufficient documentation

## 2022-01-11 DIAGNOSIS — I10 Essential (primary) hypertension: Secondary | ICD-10-CM | POA: Insufficient documentation

## 2022-01-11 DIAGNOSIS — I2510 Atherosclerotic heart disease of native coronary artery without angina pectoris: Secondary | ICD-10-CM | POA: Insufficient documentation

## 2022-01-11 MED ORDER — TOUJEO MAX U-300 SOLOSTAR 300 UNIT/ML (3 ML) SUBCUTANEOUS INSULIN PEN
10.0000 [IU] | PEN_INJECTOR | Freq: Every day | SUBCUTANEOUS | 5 refills | Status: DC
Start: 2022-01-11 — End: 2022-01-12

## 2022-01-11 MED ORDER — TRAMADOL 50 MG TABLET
1.0000 | ORAL_TABLET | Freq: Four times a day (QID) | ORAL | 1 refills | Status: DC | PRN
Start: 2022-01-11 — End: 2022-01-12

## 2022-01-11 NOTE — Progress Notes (Signed)
FAMILY MEDICINE, [facility] [address]  [place] [address]  Phone: [phone]  Fax: [phone]    Encounter Date: 01/11/2022    Patient ID:  Molly Howell  BEM:[id number]    DOB: [birth year]  Age: 76 y.o. female    There are no exam notes on file for this visit.  Subjective:     Chief Complaint   Patient presents with    Diabetes Follow up     States blood sugars have been up and down.  Reports increased pain to hands d/t arthritis.     Pt here for DM, HTN f/u.  Her A1C in [place] was 8.2.  she does not think her diet is much different or activity level is different than before she moved.  She has an eye appt in Jan.    Hypertension-   Controlled-yes  Gout-controlled on current meds         Current Outpatient Medications   Medication Sig    allopurinoL (ZYLOPRIM) 100 mg Oral Tablet Take 1 Tablet (100 mg total) by mouth Once a day    Apple Cider Vinegar 300 mg Oral Tablet Take by mouth    aspirin 325 mg Oral Tablet Take 1 Tablet (325 mg total) by mouth Once a day    atenoloL (TENORMIN) 25 mg Oral Tablet Take 1 Tablet (25 mg total) by mouth Once a day    cholecalciferol, vitamin D3, 25 mcg (1,000 unit) Oral Tablet Take 1 Tablet (1,000 Units total) by mouth Once a day    colchicine, gout, 0.6 mg Oral Tablet Take 1 Tablet (0.6 mg total) by mouth Once a day    cyanocobalamin, vitamin B-12, 5,000 mcg Sublingual Tablet, Sublingual Place under the tongue    insulin glargine U-300 conc (TOUJEO MAX U-300 SOLOSTAR) 300 unit/mL (3 mL) Subcutaneous Insulin Pen Inject 10 Units under the skin Once a day    insulin lispro 100 units/mL Subcutaneous Injectable Inject 0-12 Units under the skin 5 units at breakfast  10 units at lunch  5 units at dinner    isosorbide mononitrate (IMDUR) 30 mg Oral Tablet Sustained Release 24 hr Take 1 Tablet (30 mg total) by mouth Every morning    lisinopriL (PRINIVIL) 20 mg Oral Tablet Take 1 Tablet (20 mg total) by mouth Once a day    magnesium oxide (MAG-OX) 400 mg Oral  Tablet Take 1 Tablet (400 mg total) by mouth Once a day    multivit-minerals/folic acid (MULTIVITAMIN GUMMIES ORAL) Take 1 Tablet by mouth Once a day    omeprazole (PRILOSEC) 40 mg Oral Capsule, Delayed Release(E.C.) Take 1 Capsule (40 mg total) by mouth Once a day    pregabalin (LYRICA) 150 mg Oral Capsule Take 1 Capsule (150 mg total) by mouth Twice daily    rosuvastatin (CRESTOR) 10 mg Oral Tablet Take 1 Tablet (10 mg total) by mouth Every evening    traMADoL (ULTRAM) 50 mg Oral Tablet Take 1 Tablet (50 mg total) by mouth Every 6 hours as needed for Pain     No Known Allergies  Past Medical History:   Diagnosis Date    Diabetes mellitus, type 2 (CMS HCC)     Gout, unspecified     Hypertension          Social History     Tobacco Use    Smoking status: Former     Packs/day: 1     Types: Cigarettes    Smokeless tobacco: Never  Vaping Use    Vaping Use: Never used   Substance Use Topics    Alcohol use: Never    Drug use: Never         Review of Systems   Constitutional: Negative.    Respiratory: Negative.     Cardiovascular: Negative.    Musculoskeletal:  Positive for arthralgias.     Objective:    Vitals: BP 130/60 (Site: Left, Patient Position: Sitting, Cuff Size: Adult)   Pulse 78   Resp 14   Ht 1.6 m (_0 )   Wt 62.6 kg (138 lb)   BMI 24.45 kg/m           Physical Exam  Vitals and nursing note reviewed.   Eyes:      Comments: glasses   Cardiovascular:      Rate and Rhythm: Normal rate and regular rhythm.      Pulses: Normal pulses.      Heart sounds: Normal heart sounds.   Pulmonary:      Effort: Pulmonary effort is normal.      Breath sounds: Normal breath sounds.   Neurological:      Mental Status: She is alert.         Results for orders placed or performed in visit on 12/11/21 (from the past 4032 hour(s))   URINE CULTURE,ROUTINE    Specimen: Urine, Clean Catch   Result Value Ref Range    URINE CULTURE >100,000 Klebsiella pneumoniae (A)        Susceptibility    Klebsiella pneumoniae -  (no method  available)     Amikacin <=16 Sensitive      Ampicillin >16 Resistant      Ampicillin/Sulbactam <=8/4 Sensitive      Aztreonam <=4 Sensitive      Cefazolin <=2 Sensitive      Cefepime <=2 Sensitive      Cefoxitin <=8 Sensitive      Ceftazidime <=1 Sensitive      Ceftriaxone <=1 Sensitive      Ciprofloxacin <=1 Sensitive      Gentamicin <=4 Sensitive      Levofloxacin <=2 Sensitive      Nitrofurantoin <=32 Sensitive      Piperacillin/Tazobactam <=16 Sensitive      Tobramycin <=4 Sensitive      Trimethoprim/Sulfamethoxazole <=2/38 Sensitive    COMPREHENSIVE METABOLIC PNL, FASTING   Result Value Ref Range    SODIUM 139 137 - 145 mmol/L    POTASSIUM 5.0 3.5 - 5.1 mmol/L    CHLORIDE 107 98 - 107 mmol/L    CO2 TOTAL 19 (L) 22 - 30 mmol/L    ANION GAP 13 5 - 19 mmol/L    BUN 24 (H) 7 - 17 mg/dL    CREATININE 1.08 (H) 0.52 - 1.00 mg/dL    BUN/CREA RATIO 22 (H) 6 - 20    ESTIMATED GFR 53 (L) >60 mL/min/1.1m2    ALBUMIN 4.2 3.5 - 5.0 g/dL    CALCIUM 9.8 8.4 - 10.2 mg/dL    GLUCOSE 121 (H) 74 - 106 mg/dL    ALKALINE PHOSPHATASE 128 (H) 38 - 126 U/L    ALT (SGPT) 24 <35 U/L    AST (SGOT) 64 (H) 14 - 36 U/L    BILIRUBIN TOTAL 0.5 0.2 - 1.3 mg/dL    PROTEIN TOTAL 7.4 6.3 - 8.2 g/dL    ALBUMIN/GLOBULIN RATIO 1.3 (L) 1.5 - 2.5    Narrative    Estimated Glomerular Filtration Rate (eGFR) is  calculated using the CKD-EPI (2021) equation, intended for patients 35 years of age and older. If gender is not documented or "unknown", there will be no eGFR calculation.   LIPID PANEL   Result Value Ref Range    CHOLESTEROL 88 0 - 200 mg/dL    TRIGLYCERIDES 175 (H) <150 mg/dL    HDL CHOL 33 (L) >40 mg/dL    LDL CALC 20 <100 mg/dL   MICROALBUMIN/CREATININE RATIO, URINE, RANDOM   Result Value Ref Range    CREATININE RANDOM URINE 55 No Reference Range Established mg/dL    MICROALBUMIN RANDOM URINE 15.1 (H) 0.0 - 1.7 mg/dL    MICROALBUMIN/CREATININE RATIO RANDOM URINE 274.5 (H) 0.0 - 29.9 mg/g   URINALYSIS WITH REFLEX MICROSCOPIC AND CULTURE IF  POSITIVE    Specimen: Urine, Clean Catch    Narrative    The following orders were created for panel order URINALYSIS WITH REFLEX MICROSCOPIC AND CULTURE IF POSITIVE.  Procedure                               Abnormality         Status                     ---------                               -----------         ------                     URINALYSIS, MACRO/MICRO[[id number]]      Abnormal            Final result                 Please view results for these tests on the individual orders.   HGA1C (HEMOGLOBIN A1C WITH EST AVG GLUCOSE)   Result Value Ref Range    HEMOGLOBIN A1C 7.8 (H) 4.0 - 6.0 %    ESTIMATED AVERAGE GLUCOSE 177 mg/dL   VITAMIN D 25 TOTAL   Result Value Ref Range    VITAMIN D 54 30 - 100 ng/mL   URIC ACID   Result Value Ref Range    URIC ACID 7.0 (H) 2.5 - 6.2 mg/dL   URINALYSIS, MACRO/MICRO   Result Value Ref Range    COLOR Yellow     APPEARANCE Clear Clear, Hazy, Cloudy, Slightly Cloudy    SPECIFIC GRAVITY 1.011 1.003 - 1.035    PH 6.0 5.0 - 9.0    LEUKOCYTES Large (A) Negative WBCs/uL    NITRITE Positive (A) Negative    PROTEIN 30 (A) Negative, 10 , 20  mg/dL    GLUCOSE Negative 30 , Negative mg/dL    KETONES Negative Negative mg/dL    UROBILINOGEN < 2.0 < 2.0, 1.0, 0.2  mg/dL    BILIRUBIN Negative Negative mg/dL    BLOOD Negative Negative, Trace mg/dL    RBCS 0-2 0-2, None /hpf    WBCS TNTC (A) 0 - 4 /hpf    BACTERIA Few None, Few /hpf    SQUAMOUS EPITHELIAL Few None, Few /hpf    MUCOUS Rare None, Rare, Occasional, Few, Mod /hpf    AMORPHOUS SEDIMENT Few None, Few, Mod /hpf    WHITE BLOOD CELL CLUMP Few (A) None /hpf   MICRO HOLD   Result Value Ref Range  RAINBOW/EXTRA TUBE AUTO RESULT Yes        The PHQ 2 Total: 2 depression screen is interpreted as negative.              Assessment & Plan:     ENCOUNTER DIAGNOSES     ICD-10-CM   1. Diabetes mellitus, type 2 (CMS HCC)  E11.9   2. Essential hypertension  I10   3. Gout, unspecified cause, unspecified chronicity, unspecified site  M10.9       Increase toujou to 10 units qhs  Labs ordered for next appt  Yearly diabetic eye exam  Check BS at home  Continue atenolol, lisinopril for HTN  Continue colchicine and allopurinol for gout  Continue Crestor for lipids/CAD/DM    counseled on diet and exercise and importance of physical fitness and caloric restriction in minimizing risk for diabetes and other chronic medical conditions.  Dietary Recommendations:  -avoid all sugar in your diet, candy, sugar sweetened beverages, sugary snacks like ice cream or candy bars or other candy  -avoid processed foods when you eat out try to order a healthy meat choice, salad and vegetables avoid eating the bread  -avoid wheat products such as pasta and bread you can substitute with low carb wraps and wild rice or even small amounts of white rice  -focus your plate on healthy protein choices such as meats, tofu, and green veggies  -for snacks consider veggies and small amounts of fruit and popcorn  -drink plenty of water 1/2 oz for every pound of body weight      Orders Placed This Encounter    COMPREHENSIVE METABOLIC PNL, FASTING    YKZ9D (HEMOGLOBIN A1C WITH EST AVG GLUCOSE)    LIPID PANEL    insulin glargine U-300 conc (TOUJEO MAX U-300 SOLOSTAR) 300 unit/mL (3 mL) Subcutaneous Insulin Pen                    Return in about 3 months (around 04/12/2022) for In Person Visit, diabetes.      Serita Grammes, DO  01/11/2022, 14:25

## 2022-01-11 NOTE — Patient Instructions (Signed)
Increase toujou to 10 units qhs  Continue current medications   Tramadol renewed  Labs ordered for next appt  Yearly diabetic eye exam  Check BS at home  counseled on diet and exercise and importance of physical fitness and caloric restriction in minimizing risk for diabetes and other chronic medical conditions. Dietary Recommendations:  -avoid all sugar in your diet, candy, sugar sweetened beverages, sugary snacks like ice cream or candy bars or other candy  -avoid processed foods when you eat out try to order a healthy meat choice, salad and vegetables avoid eating the bread  -avoid wheat products such as pasta and bread you can substitute with low carb wraps and wild rice or even small amounts of white rice  -focus your plate on healthy protein choices such as meats, tofu, and green veggies  -for snacks consider veggies and small amounts of fruit and popcorn  -drink plenty of water 1/2 oz for every pound of body weight

## 2022-01-12 ENCOUNTER — Telehealth (INDEPENDENT_AMBULATORY_CARE_PROVIDER_SITE_OTHER): Payer: Self-pay | Admitting: Internal Medicine

## 2022-01-12 MED ORDER — TRAMADOL 50 MG TABLET
1.0000 | ORAL_TABLET | Freq: Four times a day (QID) | ORAL | 1 refills | Status: DC | PRN
Start: 2022-01-12 — End: 2022-01-21

## 2022-01-12 MED ORDER — TOUJEO MAX U-300 SOLOSTAR 300 UNIT/ML (3 ML) SUBCUTANEOUS INSULIN PEN
10.0000 [IU] | PEN_INJECTOR | Freq: Every day | SUBCUTANEOUS | 5 refills | Status: DC
Start: 2022-01-12 — End: 2022-03-02

## 2022-01-12 NOTE — Telephone Encounter (Signed)
Pick up these medications at [id number] - [place], [place] - [address]   Toujeo Max U-300 SoloStar 300 unit/mL (3 mL) Insulin Pen  Your estimated payment per fill: $0   traMADoL 50 mg Tablet  Your estimated payment per fill: $0  Address: [place], [place] [address]   Phone: [phone]    [facility]eds [facility]    .Daine Gip

## 2022-01-19 ENCOUNTER — Other Ambulatory Visit (INDEPENDENT_AMBULATORY_CARE_PROVIDER_SITE_OTHER): Payer: Self-pay | Admitting: Internal Medicine

## 2022-01-19 MED ORDER — ATENOLOL 25 MG TABLET
25.0000 mg | ORAL_TABLET | Freq: Every day | ORAL | 1 refills | Status: DC
Start: 2022-01-19 — End: 2022-01-21

## 2022-01-21 ENCOUNTER — Other Ambulatory Visit (INDEPENDENT_AMBULATORY_CARE_PROVIDER_SITE_OTHER): Payer: Self-pay | Admitting: Internal Medicine

## 2022-01-21 MED ORDER — TRAMADOL 50 MG TABLET
1.0000 | ORAL_TABLET | Freq: Four times a day (QID) | ORAL | 1 refills | Status: DC | PRN
Start: 2022-01-21 — End: 2022-02-21

## 2022-01-21 MED ORDER — ATENOLOL 25 MG TABLET
25.0000 mg | ORAL_TABLET | Freq: Every day | ORAL | 1 refills | Status: DC
Start: 2022-01-21 — End: 2022-07-18

## 2022-02-01 ENCOUNTER — Other Ambulatory Visit (INDEPENDENT_AMBULATORY_CARE_PROVIDER_SITE_OTHER): Payer: Self-pay | Admitting: Internal Medicine

## 2022-02-01 MED ORDER — OMEPRAZOLE 40 MG CAPSULE,DELAYED RELEASE
40.0000 mg | DELAYED_RELEASE_CAPSULE | Freq: Every day | ORAL | 1 refills | Status: DC
Start: 2022-02-01 — End: 2022-08-10

## 2022-02-14 ENCOUNTER — Other Ambulatory Visit (INDEPENDENT_AMBULATORY_CARE_PROVIDER_SITE_OTHER): Payer: Self-pay | Admitting: Internal Medicine

## 2022-02-14 DIAGNOSIS — M109 Gout, unspecified: Secondary | ICD-10-CM

## 2022-02-14 MED ORDER — COLCHICINE 0.6 MG TABLET
0.6000 mg | ORAL_TABLET | Freq: Every day | ORAL | 5 refills | Status: DC
Start: 2022-02-14 — End: 2023-01-16

## 2022-02-14 MED ORDER — INDOMETHACIN 25 MG CAPSULE
25.0000 mg | ORAL_CAPSULE | Freq: Three times a day (TID) | ORAL | 5 refills | Status: DC | PRN
Start: 2022-02-14 — End: 2022-04-04

## 2022-02-21 ENCOUNTER — Other Ambulatory Visit (INDEPENDENT_AMBULATORY_CARE_PROVIDER_SITE_OTHER): Payer: Self-pay | Admitting: Internal Medicine

## 2022-02-21 MED ORDER — LISINOPRIL 20 MG TABLET
20.0000 mg | ORAL_TABLET | Freq: Every day | ORAL | 1 refills | Status: DC
Start: 2022-02-21 — End: 2022-08-23

## 2022-02-21 MED ORDER — ISOSORBIDE MONONITRATE ER 30 MG TABLET,EXTENDED RELEASE 24 HR
30.0000 mg | ORAL_TABLET | Freq: Every morning | ORAL | 1 refills | Status: DC
Start: 2022-02-21 — End: 2022-08-23

## 2022-02-21 MED ORDER — TRAMADOL 50 MG TABLET
1.0000 | ORAL_TABLET | Freq: Four times a day (QID) | ORAL | 1 refills | Status: DC | PRN
Start: 2022-02-21 — End: 2022-04-04

## 2022-02-23 ENCOUNTER — Ambulatory Visit (INDEPENDENT_AMBULATORY_CARE_PROVIDER_SITE_OTHER): Payer: Commercial Managed Care - PPO | Admitting: Foot & Ankle Surgery

## 2022-02-23 ENCOUNTER — Encounter (INDEPENDENT_AMBULATORY_CARE_PROVIDER_SITE_OTHER): Payer: Self-pay | Admitting: Foot & Ankle Surgery

## 2022-02-23 ENCOUNTER — Other Ambulatory Visit: Payer: Self-pay

## 2022-02-23 DIAGNOSIS — E1149 Type 2 diabetes mellitus with other diabetic neurological complication: Secondary | ICD-10-CM

## 2022-02-23 DIAGNOSIS — M2041 Other hammer toe(s) (acquired), right foot: Secondary | ICD-10-CM

## 2022-02-23 NOTE — Progress Notes (Signed)
Molly Howell, Molly  Howell [address]            Name: Molly Howell MRN:  [id number]   Date: 02/23/2022 Age: 77 y.o.         Chief Complaint:   Chief Complaint   Patient presents with    Foot Ulcer     PT is here to follow up with the R foot today        Subjective  Molly Howell is a 77 y.o. female who presents to clinic for follow-up of right foot wound.  She continues to use TheraHoney gel.  She has been wearing diabetic shoes.  The shoes or 50-month-old.  She relates no redness no swelling no drainage no pain associated with the right foot      Objective    On physical examination Molly Howell is seated comfortably in the examination room in no apparent distress.  Alert and oriented to time and place. Mood and affect are normal and appropriate to situation.  She  appears well developed, well nourished, with good attention to hygiene and body habitus.    Skin:  Area of ulceration along the dorsal lateral right 5th toe with thick callus dry intra lesion bleed.  With the with debridement there is no open lesion no ulceration no active bleeding debridement  Musculoskeletal:  Right 5th toe contracted at the IPJ, rigid  Neurological:  Does not respond to pain stimuli right forefoot  Vascular:  Skin temperature is warm to warm proximal to distal right.  CFT is immediate to all toes        Assessment/ Plan  Molly Howell was seen today for foot ulcer.    Diagnoses and all orders for this visit:    Hammer toe of right foot    Type II or unspecified type diabetes mellitus with neurological manifestations, uncontrolled(250.62) (CMS [place])        1. Today I explained the etiology, prognosis, and treatment options for preulcerative callus right 5th toe secondary to diabetic neuropathy and hammertoe deformity.    2. I discussed treatment options including offloading, callus debridement.    3. Today I recommended continue with accommodative shoes.  I debrided callus to  level of comfort using a 15. Blade right 5th toe.  No ulcer bleeding debridement.  Advised her to monitor for pain redness swelling drainage call with questions or concerns.  I advised her that there is not a current need to use TheraHoney gel.  She verbalized understanding.  4. Follow up in 2 months sooner if needed.      Molly Howell, DPM        This note was partially created using M*Modal fluency direct system (voice recognition software ) and is inherently subject to errors including those of syntax and "sound- alike" substitutions which may escape proofreading.  In such instances, , original meaning may be extrapolated by contextual derivation.

## 2022-03-02 ENCOUNTER — Other Ambulatory Visit (INDEPENDENT_AMBULATORY_CARE_PROVIDER_SITE_OTHER): Payer: Self-pay | Admitting: Internal Medicine

## 2022-03-03 MED ORDER — TOUJEO MAX U-300 SOLOSTAR 300 UNIT/ML (3 ML) SUBCUTANEOUS INSULIN PEN
10.0000 [IU] | PEN_INJECTOR | Freq: Every day | SUBCUTANEOUS | 5 refills | Status: DC
Start: 2022-03-03 — End: 2022-04-12

## 2022-03-03 MED ORDER — INSULIN LISPRO (U-100) 100 UNIT/ML SUBCUTANEOUS PEN
15.0000 [IU] | PEN_INJECTOR | Freq: Two times a day (BID) | SUBCUTANEOUS | 1 refills | Status: DC
Start: 2022-03-03 — End: 2022-03-16

## 2022-03-16 ENCOUNTER — Other Ambulatory Visit (INDEPENDENT_AMBULATORY_CARE_PROVIDER_SITE_OTHER): Payer: Self-pay | Admitting: Internal Medicine

## 2022-03-16 MED ORDER — INSULIN ASPART (U-100) 100 UNIT/ML (3 ML) SUBCUTANEOUS PEN
15.0000 [IU] | PEN_INJECTOR | Freq: Two times a day (BID) | SUBCUTANEOUS | 1 refills | Status: DC
Start: 2022-03-16 — End: 2022-07-18

## 2022-03-21 ENCOUNTER — Other Ambulatory Visit (INDEPENDENT_AMBULATORY_CARE_PROVIDER_SITE_OTHER): Payer: Self-pay | Admitting: Internal Medicine

## 2022-03-21 MED ORDER — ALLOPURINOL 100 MG TABLET
100.0000 mg | ORAL_TABLET | Freq: Every day | ORAL | 1 refills | Status: DC
Start: 2022-03-21 — End: 2022-09-20

## 2022-03-21 MED ORDER — PREGABALIN 150 MG CAPSULE
150.0000 mg | ORAL_CAPSULE | Freq: Two times a day (BID) | ORAL | 0 refills | Status: DC
Start: 2022-03-21 — End: 2022-06-23

## 2022-04-04 ENCOUNTER — Other Ambulatory Visit (INDEPENDENT_AMBULATORY_CARE_PROVIDER_SITE_OTHER): Payer: Self-pay | Admitting: Internal Medicine

## 2022-04-04 MED ORDER — TRAMADOL 50 MG TABLET
1.0000 | ORAL_TABLET | Freq: Four times a day (QID) | ORAL | 1 refills | Status: DC | PRN
Start: 2022-04-04 — End: 2022-06-07

## 2022-04-04 MED ORDER — INDOMETHACIN 25 MG CAPSULE
25.0000 mg | ORAL_CAPSULE | Freq: Three times a day (TID) | ORAL | 5 refills | Status: DC | PRN
Start: 2022-04-04 — End: 2022-05-13

## 2022-04-11 ENCOUNTER — Other Ambulatory Visit: Payer: Medicare HMO | Attending: Internal Medicine

## 2022-04-11 ENCOUNTER — Other Ambulatory Visit: Payer: Self-pay

## 2022-04-11 DIAGNOSIS — I10 Essential (primary) hypertension: Secondary | ICD-10-CM | POA: Insufficient documentation

## 2022-04-11 DIAGNOSIS — E119 Type 2 diabetes mellitus without complications: Secondary | ICD-10-CM | POA: Insufficient documentation

## 2022-04-11 LAB — LIPID PANEL
CHOLESTEROL: 101 mg/dL (ref 0–200)
HDL CHOL: 36 mg/dL — ABNORMAL LOW (ref >40)
LDL CALC: 31 mg/dL (ref <100)
TRIGLYCERIDES: 168 mg/dL — ABNORMAL HIGH (ref <150)

## 2022-04-11 LAB — HGA1C (HEMOGLOBIN A1C WITH EST AVG GLUCOSE)
ESTIMATED AVERAGE GLUCOSE: 197 mg/dL
HEMOGLOBIN A1C: 8.5 % — ABNORMAL HIGH (ref 4.0–6.0)

## 2022-04-11 LAB — COMPREHENSIVE METABOLIC PNL, FASTING
ALBUMIN/GLOBULIN RATIO: 1.3 — ABNORMAL LOW (ref 1.5–2.5)
ALBUMIN: 3.9 g/dL (ref 3.5–5.0)
ALKALINE PHOSPHATASE: 121 U/L (ref 38–126)
ALT (SGPT): 15 U/L (ref <35)
ANION GAP: 9 mmol/L (ref 5–19)
AST (SGOT): 29 U/L (ref 14–36)
BILIRUBIN TOTAL: 0.4 mg/dL (ref 0.2–1.3)
BUN/CREA RATIO: 28 — ABNORMAL HIGH (ref 6–20)
BUN: 30 mg/dL — ABNORMAL HIGH (ref 7–17)
CALCIUM: 10 mg/dL (ref 8.4–10.2)
CHLORIDE: 106 mmol/L (ref 98–107)
CO2 TOTAL: 24 mmol/L (ref 22–30)
CREATININE: 1.09 mg/dL — ABNORMAL HIGH (ref 0.52–1.00)
ESTIMATED GFR: 53 mL/min/{1.73_m2} — ABNORMAL LOW (ref >60)
GLUCOSE: 168 mg/dL — ABNORMAL HIGH (ref 74–106)
POTASSIUM: 4.8 mmol/L (ref 3.5–5.1)
PROTEIN TOTAL: 6.8 g/dL (ref 6.3–8.2)
SODIUM: 139 mmol/L (ref 137–145)

## 2022-04-12 ENCOUNTER — Ambulatory Visit: Payer: Medicare HMO | Attending: Internal Medicine | Admitting: Internal Medicine

## 2022-04-12 ENCOUNTER — Encounter (INDEPENDENT_AMBULATORY_CARE_PROVIDER_SITE_OTHER): Payer: Self-pay | Admitting: Internal Medicine

## 2022-04-12 VITALS — BP 130/52 | HR 86 | Ht 63.0 in | Wt 141.9 lb

## 2022-04-12 DIAGNOSIS — Z7984 Long term (current) use of oral hypoglycemic drugs: Secondary | ICD-10-CM

## 2022-04-12 DIAGNOSIS — E1165 Type 2 diabetes mellitus with hyperglycemia: Secondary | ICD-10-CM | POA: Insufficient documentation

## 2022-04-12 DIAGNOSIS — I10 Essential (primary) hypertension: Secondary | ICD-10-CM | POA: Insufficient documentation

## 2022-04-12 DIAGNOSIS — Z794 Long term (current) use of insulin: Secondary | ICD-10-CM | POA: Insufficient documentation

## 2022-04-12 MED ORDER — INSULIN GLARGINE (U-300) CONC. 300 UNIT/ML (3 ML) SUBCUTANEOUS PEN
20.0000 [IU] | PEN_INJECTOR | Freq: Every day | SUBCUTANEOUS | 5 refills | Status: DC
Start: 2022-04-12 — End: 2022-08-02

## 2022-04-12 NOTE — Patient Instructions (Signed)
Increase toujeo to 20 units qhs  Continue current medications   I have ordered labs for your next appointment.  Please have your labs done about 1 week before your next appointment.

## 2022-04-12 NOTE — Progress Notes (Signed)
FAMILY MEDICINE, [facility] [address]  [place] [address]  Phone: [phone]  Fax: [phone]    Encounter Date: 04/12/2022    Patient ID:  Molly Howell  [id number]    DOB: December 22, 1945  Age: 77 y.o. female    There are no exam notes on file for this visit.  Subjective:     Chief Complaint   Patient presents with    Follow Up 3 Months     Pt here for 3 month f/u. Pt without any complaints. Pt admits she has not been watching her diet closely.  She has been eating too much pasta.     Hypertension-   Controlled-yes     Diabetes   Controlled-no   Checking BS at home-yes   Eye exam-appt this month          Current Outpatient Medications   Medication Sig    allopurinoL (ZYLOPRIM) 100 mg Oral Tablet Take 1 Tablet (100 mg total) by mouth Once a day for 180 days    Apple Cider Vinegar 300 mg Oral Tablet Take by mouth    aspirin 325 mg Oral Tablet Take 1 Tablet (325 mg total) by mouth Once a day    atenoloL (TENORMIN) 25 mg Oral Tablet Take 1 Tablet (25 mg total) by mouth Once a day    cholecalciferol, vitamin D3, 25 mcg (1,000 unit) Oral Tablet Take 1 Tablet (1,000 Units total) by mouth Once a day    colchicine 0.6 mg Oral Tablet Take 1 Tablet (0.6 mg total) by mouth Once a day    cyanocobalamin, vitamin B-12, 5,000 mcg Sublingual Tablet, Sublingual Place under the tongue    indomethacin (INDOCIN) 25 mg Oral Capsule Take 1 Capsule (25 mg total) by mouth Three times a day as needed    insulin aspart U-100 (NOVOLOG FLEXPEN U-100 INSULIN) 100 unit/mL (3 mL) Subcutaneous Insulin Pen Inject 15 Units under the skin Twice a day before meals for 180 days Indications: type 2 diabetes mellitus    insulin glargine U-300 conc (TOUJEO MAX U-300 SOLOSTAR) 300 unit/mL (3 mL) Subcutaneous Insulin Pen Inject 20 Units under the skin Once a day    isosorbide mononitrate (IMDUR) 30 mg Oral Tablet Sustained Release 24 hr Take 1 Tablet (30 mg total) by mouth Every morning    lisinopriL (PRINIVIL) 20 mg Oral Tablet  Take 1 Tablet (20 mg total) by mouth Once a day    magnesium oxide (MAG-OX) 400 mg Oral Tablet Take 1 Tablet (400 mg total) by mouth Once a day    multivit-minerals/folic acid (MULTIVITAMIN GUMMIES ORAL) Take 1 Tablet by mouth Once a day    omeprazole (PRILOSEC) 40 mg Oral Capsule, Delayed Release(E.C.) Take 1 Capsule (40 mg total) by mouth Once a day    pregabalin (LYRICA) 150 mg Oral Capsule Take 1 Capsule (150 mg total) by mouth Twice daily    rosuvastatin (CRESTOR) 10 mg Oral Tablet Take 1 Tablet (10 mg total) by mouth Every evening    traMADoL (ULTRAM) 50 mg Oral Tablet Take 1 Tablet (50 mg total) by mouth Every 6 hours as needed for Pain     No Known Allergies  Past Medical History:   Diagnosis Date    Diabetes mellitus, type 2 (CMS HCC)     Gout, unspecified     Hypertension          Social History     Tobacco Use    Smoking status: Former  Current packs/day: 1.00     Types: Cigarettes    Smokeless tobacco: Never   Vaping Use    Vaping status: Never Used   Substance Use Topics    Alcohol use: Never    Drug use: Never         Review of Systems   Constitutional: Negative.    Respiratory: Negative.     Cardiovascular: Negative.    Neurological:  Positive for numbness.     Objective:    Vitals: BP (!) 130/52 (Site: Right, Patient Position: Sitting, Cuff Size: Adult)   Pulse 86   Ht 1.6 m ('5\' 3"'$ )   Wt 64.4 kg (141 lb 14.4 oz)   SpO2 96%   BMI 25.14 kg/m           Physical Exam  Vitals and nursing note reviewed.   Eyes:      Comments: glasses   Cardiovascular:      Rate and Rhythm: Normal rate and regular rhythm.      Pulses: Normal pulses.      Heart sounds: Normal heart sounds.   Pulmonary:      Effort: Pulmonary effort is normal.      Breath sounds: Normal breath sounds.   Neurological:      Mental Status: She is alert.         Results for orders placed or performed in visit on 04/11/22 (from the past 4032 hour(s))   COMPREHENSIVE METABOLIC PNL, FASTING   Result Value Ref Range    SODIUM 139 137 - 145  mmol/L    POTASSIUM 4.8 3.5 - 5.1 mmol/L    CHLORIDE 106 98 - 107 mmol/L    CO2 TOTAL 24 22 - 30 mmol/L    ANION GAP 9 5 - 19 mmol/L    BUN 30 (H) 7 - 17 mg/dL    CREATININE 1.09 (H) 0.52 - 1.00 mg/dL    BUN/CREA RATIO 28 (H) 6 - 20    ESTIMATED GFR 53 (L) >60 mL/min/1.25m2    ALBUMIN 3.9 3.5 - 5.0 g/dL    CALCIUM 10.0 8.4 - 10.2 mg/dL    GLUCOSE 168 (H) 74 - 106 mg/dL    ALKALINE PHOSPHATASE 121 38 - 126 U/L    ALT (SGPT) 15 <35 U/L    AST (SGOT) 29 14 - 36 U/L    BILIRUBIN TOTAL 0.4 0.2 - 1.3 mg/dL    PROTEIN TOTAL 6.8 6.3 - 8.2 g/dL    ALBUMIN/GLOBULIN RATIO 1.3 (L) 1.5 - 2.5    Narrative    Estimated Glomerular Filtration Rate (eGFR) is calculated using the CKD-EPI (2021) equation, intended for patients 153years of age and older. If gender is not documented or "unknown", there will be no eGFR calculation.   HGA1C (HEMOGLOBIN A1C WITH EST AVG GLUCOSE)   Result Value Ref Range    HEMOGLOBIN A1C 8.5 (H) 4.0 - 6.0 %    ESTIMATED AVERAGE GLUCOSE 197 mg/dL   LIPID PANEL   Result Value Ref Range    CHOLESTEROL 101 0 - 200 mg/dL    TRIGLYCERIDES 168 (H) <150 mg/dL    HDL CHOL 36 (L) >40 mg/dL    LDL CALC 31 <100 mg/dL   Results for orders placed or performed in visit on 12/11/21 (from the past 4032 hour(s))   URINE CULTURE,ROUTINE    Specimen: Urine, Clean Catch   Result Value Ref Range    URINE CULTURE >100,000 Klebsiella pneumoniae (A)  Susceptibility    Klebsiella pneumoniae -  (no method available)     Amikacin <=16 Sensitive      Ampicillin >16 Resistant      Ampicillin/Sulbactam <=8/4 Sensitive      Aztreonam <=4 Sensitive      Cefazolin <=2 Sensitive      Cefepime <=2 Sensitive      Cefoxitin <=8 Sensitive      Ceftazidime <=1 Sensitive      Ceftriaxone <=1 Sensitive      Ciprofloxacin <=1 Sensitive      Gentamicin <=4 Sensitive      Levofloxacin <=2 Sensitive      Nitrofurantoin <=32 Sensitive      Piperacillin/Tazobactam <=16 Sensitive      Tobramycin <=4 Sensitive      Trimethoprim/Sulfamethoxazole  <=2/38 Sensitive    COMPREHENSIVE METABOLIC PNL, FASTING   Result Value Ref Range    SODIUM 139 137 - 145 mmol/L    POTASSIUM 5.0 3.5 - 5.1 mmol/L    CHLORIDE 107 98 - 107 mmol/L    CO2 TOTAL 19 (L) 22 - 30 mmol/L    ANION GAP 13 5 - 19 mmol/L    BUN 24 (H) 7 - 17 mg/dL    CREATININE 1.08 (H) 0.52 - 1.00 mg/dL    BUN/CREA RATIO 22 (H) 6 - 20    ESTIMATED GFR 53 (L) >60 mL/min/1.70m2    ALBUMIN 4.2 3.5 - 5.0 g/dL    CALCIUM 9.8 8.4 - 10.2 mg/dL    GLUCOSE 121 (H) 74 - 106 mg/dL    ALKALINE PHOSPHATASE 128 (H) 38 - 126 U/L    ALT (SGPT) 24 <35 U/L    AST (SGOT) 64 (H) 14 - 36 U/L    BILIRUBIN TOTAL 0.5 0.2 - 1.3 mg/dL    PROTEIN TOTAL 7.4 6.3 - 8.2 g/dL    ALBUMIN/GLOBULIN RATIO 1.3 (L) 1.5 - 2.5    Narrative    Estimated Glomerular Filtration Rate (eGFR) is calculated using the CKD-EPI (2021) equation, intended for patients 167years of age and older. If gender is not documented or "unknown", there will be no eGFR calculation.   LIPID PANEL   Result Value Ref Range    CHOLESTEROL 88 0 - 200 mg/dL    TRIGLYCERIDES 175 (H) <150 mg/dL    HDL CHOL 33 (L) >40 mg/dL    LDL CALC 20 <100 mg/dL   MICROALBUMIN/CREATININE RATIO, URINE, RANDOM   Result Value Ref Range    CREATININE RANDOM URINE 55 No Reference Range Established mg/dL    MICROALBUMIN RANDOM URINE 15.1 (H) 0.0 - 1.7 mg/dL    MICROALBUMIN/CREATININE RATIO RANDOM URINE 274.5 (H) 0.0 - 29.9 mg/g   URINALYSIS WITH REFLEX MICROSCOPIC AND CULTURE IF POSITIVE    Specimen: Urine, Clean Catch    Narrative    The following orders were created for panel order URINALYSIS WITH REFLEX MICROSCOPIC AND CULTURE IF POSITIVE.  Procedure                               Abnormality         Status                     ---------                               -----------         ------  URINALYSIS, MACRO/MICRO[[id number]]      Abnormal            Final result                 Please view results for these tests on the individual orders.   HGA1C (HEMOGLOBIN A1C WITH EST AVG  GLUCOSE)   Result Value Ref Range    HEMOGLOBIN A1C 7.8 (H) 4.0 - 6.0 %    ESTIMATED AVERAGE GLUCOSE 177 mg/dL   VITAMIN D 25 TOTAL   Result Value Ref Range    VITAMIN D 54 30 - 100 ng/mL   URIC ACID   Result Value Ref Range    URIC ACID 7.0 (H) 2.5 - 6.2 mg/dL   URINALYSIS, MACRO/MICRO   Result Value Ref Range    COLOR Yellow     APPEARANCE Clear Clear, Hazy, Cloudy, Slightly Cloudy    SPECIFIC GRAVITY 1.011 1.003 - 1.035    PH 6.0 5.0 - 9.0    LEUKOCYTES Large (A) Negative WBCs/uL    NITRITE Positive (A) Negative    PROTEIN 30 (A) Negative, 10 , 20  mg/dL    GLUCOSE Negative 30 , Negative mg/dL    KETONES Negative Negative mg/dL    UROBILINOGEN < 2.0 < 2.0, 1.0, 0.2  mg/dL    BILIRUBIN Negative Negative mg/dL    BLOOD Negative Negative, Trace mg/dL    RBCS 0-2 0-2, None /hpf    WBCS TNTC (A) 0 - 4 /hpf    BACTERIA Few None, Few /hpf    SQUAMOUS EPITHELIAL Few None, Few /hpf    MUCOUS Rare None, Rare, Occasional, Few, Mod /hpf    AMORPHOUS SEDIMENT Few None, Few, Mod /hpf    WHITE BLOOD CELL CLUMP Few (A) None /hpf   MICRO HOLD   Result Value Ref Range    RAINBOW/EXTRA TUBE AUTO RESULT Yes        Depression screening is negative. PHQ 2 Total: 0               Assessment & Plan:     ENCOUNTER DIAGNOSES     ICD-10-CM   1. Essential hypertension  I10   2. Type 2 diabetes mellitus with hyperglycemia, with long-term current use of insulin (CMS HCC)  E11.65    Z79.4    Increase toujeo 20 units qhs  Continue Novolog BID with meals  Continue lisinopril for HTN and DM renal protection  Continue Crestor for lipids  Continue Lyrica for DM neuropathy  Labs ordered for next appt  Yearly diabetic eye exam  counseled on diet and exercise and importance of physical fitness and caloric restriction in minimizing risk for diabetes and other chronic medical conditions.  Dietary Recommendations:  -avoid all sugar in your diet, candy, sugar sweetened beverages, sugary snacks like ice cream or candy bars or other candy  -avoid processed  foods when you eat out try to order a healthy meat choice, salad and vegetables avoid eating the bread  -avoid wheat products such as pasta and bread you can substitute with low carb wraps and wild rice or even small amounts of white rice  -focus your plate on healthy protein choices such as meats, tofu, and green veggies  -for snacks consider veggies and small amounts of fruit and popcorn  -drink plenty of water 1/2 oz for every pound of body weight        Orders Placed This Encounter    COMPREHENSIVE METABOLIC PNL, FASTING  HGA1C (HEMOGLOBIN A1C WITH EST AVG GLUCOSE)    LIPID PANEL    insulin glargine U-300 conc (TOUJEO MAX U-300 SOLOSTAR) 300 unit/mL (3 mL) Subcutaneous Insulin Pen                    Return in about 3 months (around 07/13/2022) for In Person Visit, diabetes.      Serita Grammes, DO  04/12/2022, 14:40

## 2022-04-26 ENCOUNTER — Encounter (INDEPENDENT_AMBULATORY_CARE_PROVIDER_SITE_OTHER): Payer: Self-pay | Admitting: Foot & Ankle Surgery

## 2022-04-26 ENCOUNTER — Ambulatory Visit (INDEPENDENT_AMBULATORY_CARE_PROVIDER_SITE_OTHER): Payer: Medicare HMO | Admitting: Foot & Ankle Surgery

## 2022-04-26 ENCOUNTER — Other Ambulatory Visit: Payer: Self-pay

## 2022-04-26 DIAGNOSIS — E1149 Type 2 diabetes mellitus with other diabetic neurological complication: Secondary | ICD-10-CM

## 2022-04-26 DIAGNOSIS — M2041 Other hammer toe(s) (acquired), right foot: Secondary | ICD-10-CM

## 2022-04-26 NOTE — Progress Notes (Signed)
[name], [place]  [place] [id number]            Name: Molly Howell MRN:  [id number]   Date: 04/26/2022 Age: 77 y.o.         Chief Complaint:   Chief Complaint   Patient presents with    Foot Pain     Patient is here to follow up with the R foot today        Subjective  Molly Howell is a 77 y.o. female who presents to clinic for follow-up of right foot preulcerative callus.  She relates no pain no redness no drainage.      Objective    On physical examination Molly Howell is seated comfortably in the examination room in no apparent distress.  Alert and oriented to time and place. Mood and affect are normal and appropriate to situation.  She  appears well developed, well nourished, with good attention to hygiene and body habitus.    Skin:  Area of ulceration along the dorsal lateral right 5th toe with thick callus dry intra lesion bleed.  With the with debridement there is no open lesion no ulceration no active bleeding debridement  Musculoskeletal:  Right 5th toe contracted at the IPJ, rigid  Neurological:  Does not respond to pain stimuli right forefoot  Vascular:  Skin temperature is warm to warm proximal to distal right.  CFT is immediate to all toes        Assessment/ Plan  Molly Howell was seen today for foot pain.    Diagnoses and all orders for this visit:    Hammer toe of right foot    Type II or unspecified type diabetes mellitus with neurological manifestations, uncontrolled(250.62) (CMS [place])        1. Today I explained the etiology, prognosis, and treatment options for preulcerative callus right 5th toe secondary to diabetic neuropathy and hammertoe deformity.    2. I discussed treatment options including offloading, callus debridement.    3. Today I recommended continue with accommodative shoes.  I debrided callus to level of comfort using a 15. Blade right 5th toe.  No ulcer bleeding debridement.  Advised her to monitor for pain redness swelling  drainage call with questions or concerns.    4. Follow up in 2-3 months sooner if needed.      [name], DPM        This note was partially created using M*Modal fluency direct system (voice recognition software ) and is inherently subject to errors including those of syntax and "sound- alike" substitutions which may escape proofreading.  In such instances, , original meaning may be extrapolated by contextual derivation.

## 2022-05-09 ENCOUNTER — Other Ambulatory Visit (INDEPENDENT_AMBULATORY_CARE_PROVIDER_SITE_OTHER): Payer: Self-pay | Admitting: Internal Medicine

## 2022-05-09 MED ORDER — ROSUVASTATIN 10 MG TABLET
10.00 mg | ORAL_TABLET | Freq: Every evening | ORAL | 1 refills | Status: AC
Start: 2022-05-09 — End: 2022-11-05

## 2022-05-13 ENCOUNTER — Other Ambulatory Visit (INDEPENDENT_AMBULATORY_CARE_PROVIDER_SITE_OTHER): Payer: Self-pay | Admitting: Internal Medicine

## 2022-05-13 MED ORDER — IBUPROFEN 600 MG TABLET
600.0000 mg | ORAL_TABLET | Freq: Three times a day (TID) | ORAL | 1 refills | Status: DC | PRN
Start: 2022-05-13 — End: 2023-04-21

## 2022-06-07 ENCOUNTER — Other Ambulatory Visit (INDEPENDENT_AMBULATORY_CARE_PROVIDER_SITE_OTHER): Payer: Self-pay | Admitting: Internal Medicine

## 2022-06-07 MED ORDER — TRAMADOL 50 MG TABLET
1.0000 | ORAL_TABLET | Freq: Four times a day (QID) | ORAL | 1 refills | Status: DC | PRN
Start: 2022-06-07 — End: 2022-08-03

## 2022-06-23 ENCOUNTER — Other Ambulatory Visit (INDEPENDENT_AMBULATORY_CARE_PROVIDER_SITE_OTHER): Payer: Self-pay | Admitting: Internal Medicine

## 2022-06-24 MED ORDER — PREGABALIN 150 MG CAPSULE
150.0000 mg | ORAL_CAPSULE | Freq: Two times a day (BID) | ORAL | 0 refills | Status: DC
Start: 2022-06-24 — End: 2022-09-23

## 2022-07-13 ENCOUNTER — Other Ambulatory Visit (INDEPENDENT_AMBULATORY_CARE_PROVIDER_SITE_OTHER): Payer: Self-pay

## 2022-07-18 ENCOUNTER — Other Ambulatory Visit (INDEPENDENT_AMBULATORY_CARE_PROVIDER_SITE_OTHER): Payer: Self-pay | Admitting: Internal Medicine

## 2022-07-18 MED ORDER — ATENOLOL 25 MG TABLET
25.0000 mg | ORAL_TABLET | Freq: Every day | ORAL | 1 refills | Status: DC
Start: 2022-07-18 — End: 2023-01-16

## 2022-07-18 MED ORDER — INSULIN ASPART (U-100) 100 UNIT/ML (3 ML) SUBCUTANEOUS PEN
15.0000 [IU] | PEN_INJECTOR | Freq: Two times a day (BID) | SUBCUTANEOUS | 1 refills | Status: DC
Start: 2022-07-18 — End: 2022-08-02

## 2022-07-19 ENCOUNTER — Ambulatory Visit (INDEPENDENT_AMBULATORY_CARE_PROVIDER_SITE_OTHER): Payer: Self-pay | Admitting: Internal Medicine

## 2022-07-19 ENCOUNTER — Other Ambulatory Visit (INDEPENDENT_AMBULATORY_CARE_PROVIDER_SITE_OTHER): Payer: Self-pay | Admitting: Internal Medicine

## 2022-07-19 MED ORDER — INSULIN LISPRO (U-100) 100 UNIT/ML SUBCUTANEOUS SOLUTION
15.0000 [IU] | Freq: Two times a day (BID) | SUBCUTANEOUS | 1 refills | Status: DC
Start: 2022-07-19 — End: 2023-01-04

## 2022-07-23 ENCOUNTER — Other Ambulatory Visit: Payer: Self-pay

## 2022-07-23 ENCOUNTER — Other Ambulatory Visit: Payer: Medicare HMO | Attending: Internal Medicine

## 2022-07-23 DIAGNOSIS — E1165 Type 2 diabetes mellitus with hyperglycemia: Secondary | ICD-10-CM | POA: Insufficient documentation

## 2022-07-23 DIAGNOSIS — I10 Essential (primary) hypertension: Secondary | ICD-10-CM | POA: Insufficient documentation

## 2022-07-23 DIAGNOSIS — Z794 Long term (current) use of insulin: Secondary | ICD-10-CM | POA: Insufficient documentation

## 2022-07-23 LAB — COMPREHENSIVE METABOLIC PNL, FASTING
ALBUMIN/GLOBULIN RATIO: 1.3 — ABNORMAL LOW (ref 1.5–2.5)
ALBUMIN: 3.9 g/dL (ref 3.5–5.0)
ALKALINE PHOSPHATASE: 128 U/L — ABNORMAL HIGH (ref 38–126)
ALT (SGPT): 14 U/L (ref <35)
ANION GAP: 9 mmol/L (ref 5–19)
AST (SGOT): 30 U/L (ref 14–36)
BILIRUBIN TOTAL: 0.4 mg/dL (ref 0.2–1.3)
BUN/CREA RATIO: 21 — ABNORMAL HIGH (ref 6–20)
BUN: 29 mg/dL — ABNORMAL HIGH (ref 7–17)
CALCIUM: 9.8 mg/dL (ref 8.4–10.2)
CHLORIDE: 106 mmol/L (ref 98–107)
CO2 TOTAL: 26 mmol/L (ref 22–30)
CREATININE: 1.35 mg/dL — ABNORMAL HIGH (ref 0.52–1.00)
ESTIMATED GFR: 40 mL/min/{1.73_m2} — ABNORMAL LOW (ref >60)
GLUCOSE: 93 mg/dL (ref 74–106)
POTASSIUM: 4.9 mmol/L (ref 3.5–5.1)
PROTEIN TOTAL: 6.8 g/dL (ref 6.3–8.2)
SODIUM: 141 mmol/L (ref 137–145)

## 2022-07-23 LAB — LIPID PANEL
CHOLESTEROL: 87 mg/dL (ref 0–200)
HDL CHOL: 31 mg/dL — ABNORMAL LOW (ref >40)
LDL CALC: 25 mg/dL (ref <100)
TRIGLYCERIDES: 155 mg/dL — ABNORMAL HIGH (ref <150)

## 2022-07-23 LAB — HGA1C (HEMOGLOBIN A1C WITH EST AVG GLUCOSE)
ESTIMATED AVERAGE GLUCOSE: 189 mg/dL
HEMOGLOBIN A1C: 8.2 % — ABNORMAL HIGH (ref 4.0–6.0)

## 2022-08-02 ENCOUNTER — Other Ambulatory Visit (INDEPENDENT_AMBULATORY_CARE_PROVIDER_SITE_OTHER): Payer: Self-pay | Admitting: Internal Medicine

## 2022-08-02 ENCOUNTER — Other Ambulatory Visit: Payer: Self-pay

## 2022-08-02 ENCOUNTER — Ambulatory Visit: Payer: Medicare HMO | Attending: Internal Medicine | Admitting: Internal Medicine

## 2022-08-02 ENCOUNTER — Ambulatory Visit (INDEPENDENT_AMBULATORY_CARE_PROVIDER_SITE_OTHER): Payer: Medicare HMO | Admitting: Foot & Ankle Surgery

## 2022-08-02 ENCOUNTER — Encounter (INDEPENDENT_AMBULATORY_CARE_PROVIDER_SITE_OTHER): Payer: Self-pay | Admitting: Internal Medicine

## 2022-08-02 ENCOUNTER — Encounter (INDEPENDENT_AMBULATORY_CARE_PROVIDER_SITE_OTHER): Payer: Self-pay | Admitting: Foot & Ankle Surgery

## 2022-08-02 VITALS — BP 116/50 | HR 80 | Resp 13 | Ht 63.0 in | Wt 139.0 lb

## 2022-08-02 DIAGNOSIS — E1149 Type 2 diabetes mellitus with other diabetic neurological complication: Secondary | ICD-10-CM

## 2022-08-02 DIAGNOSIS — E1140 Type 2 diabetes mellitus with diabetic neuropathy, unspecified: Secondary | ICD-10-CM | POA: Insufficient documentation

## 2022-08-02 DIAGNOSIS — E119 Type 2 diabetes mellitus without complications: Secondary | ICD-10-CM | POA: Insufficient documentation

## 2022-08-02 DIAGNOSIS — I10 Essential (primary) hypertension: Secondary | ICD-10-CM | POA: Insufficient documentation

## 2022-08-02 DIAGNOSIS — I2510 Atherosclerotic heart disease of native coronary artery without angina pectoris: Secondary | ICD-10-CM | POA: Insufficient documentation

## 2022-08-02 DIAGNOSIS — Z794 Long term (current) use of insulin: Secondary | ICD-10-CM | POA: Insufficient documentation

## 2022-08-02 DIAGNOSIS — M2041 Other hammer toe(s) (acquired), right foot: Secondary | ICD-10-CM

## 2022-08-02 MED ORDER — INSULIN GLARGINE (U-300) CONC. 300 UNIT/ML (3 ML) SUBCUTANEOUS PEN
25.0000 [IU] | PEN_INJECTOR | Freq: Every day | SUBCUTANEOUS | 5 refills | Status: DC
Start: 2022-08-02 — End: 2022-12-30

## 2022-08-02 NOTE — Progress Notes (Signed)
FAMILY MEDICINE, [facility] [address]  [place] [address]  Phone: [phone]  Fax: [phone]    Encounter Date: 08/02/2022    Patient ID:  Molly Howell  MVH:[id number]    DOB: 1945/06/02  Age: 77 y.o. female    There are no exam notes on file for this visit.  Subjective:     Chief Complaint   Patient presents with    Follow Up 3 Months     No new concerns.  Diet most concerning about A1C.     Pt here for 3 month f/u. Pt without any complaints. She is watching her diet and lost 2 lbs.     Diabetes   Controlled-no   Checking BS at home-yes, 230 after eating pasta   Eye exam-up to date  CAD-lipids controlled  Hypertension-   Controlled-yes   Checking BP at home-yes        Current Outpatient Medications   Medication Sig    allopurinoL (ZYLOPRIM) 100 mg Oral Tablet Take 1 Tablet (100 mg total) by mouth Once a day for 180 days    aspirin 325 mg Oral Tablet Take 1 Tablet (325 mg total) by mouth Once a day    atenoloL (TENORMIN) 25 mg Oral Tablet Take 1 Tablet (25 mg total) by mouth Once a day    cholecalciferol, vitamin D3, 25 mcg (1,000 unit) Oral Tablet Take 1 Tablet (1,000 Units total) by mouth Once a day    colchicine 0.6 mg Oral Tablet Take 1 Tablet (0.6 mg total) by mouth Once a day    cyanocobalamin, vitamin B-12, 5,000 mcg Sublingual Tablet, Sublingual Place under the tongue    Ibuprofen (MOTRIN) 600 mg Oral Tablet Take 1 Tablet (600 mg total) by mouth Every 8 hours as needed for Pain    insulin glargine U-300 conc (TOUJEO MAX U-300 SOLOSTAR) 300 unit/mL (3 mL) Subcutaneous Insulin Pen Inject 25 Units under the skin Once a day    insulin lispro (HUMALOG) 100 unit/mL Subcutaneous Solution Inject 15 Units under the skin Twice a day before meals for 180 days Inject 5 Units under the skin at [place].    isosorbide mononitrate (IMDUR) 30 mg Oral Tablet Sustained Release 24 hr Take 1 Tablet (30 mg total) by mouth Every morning    lisinopriL (PRINIVIL) 20 mg Oral Tablet Take 1 Tablet (20 mg  total) by mouth Once a day    magnesium oxide (MAG-OX) 400 mg Oral Tablet Take 1 Tablet (400 mg total) by mouth Once a day    multivit-minerals/folic acid (MULTIVITAMIN GUMMIES ORAL) Take 1 Tablet by mouth Once a day    omeprazole (PRILOSEC) 40 mg Oral Capsule, Delayed Release(E.C.) Take 1 Capsule (40 mg total) by mouth Once a day    pregabalin (LYRICA) 150 mg Oral Capsule Take 1 Capsule (150 mg total) by mouth Twice daily    rosuvastatin (CRESTOR) 10 mg Oral Tablet Take 1 Tablet (10 mg total) by mouth Every evening for 180 days    traMADoL (ULTRAM) 50 mg Oral Tablet Take 1 Tablet (50 mg total) by mouth Every 6 hours as needed for Pain     No Known Allergies  Past Medical History:   Diagnosis Date    Diabetes mellitus, type 2 (CMS HCC)     Gout, unspecified     Hypertension          Social History     Tobacco Use    Smoking status: Former  Current packs/day: 1.00     Types: Cigarettes    Smokeless tobacco: Never   Vaping Use    Vaping status: Never Used   Substance Use Topics    Alcohol use: Never    Drug use: Never         Review of Systems   Constitutional: Negative.    Respiratory: Negative.     Cardiovascular: Negative.      Objective:    Vitals: BP (!) 116/50 (Site: Right, Patient Position: Sitting, Cuff Size: Adult)   Pulse 80   Resp 13   Ht 1.6 m (5\' 3" )   Wt 63 kg (139 lb)   SpO2 97%   BMI 24.62 kg/m           Physical Exam  Vitals and nursing note reviewed.   Constitutional:       Appearance: She is obese.   Eyes:      Comments: glasses   Cardiovascular:      Rate and Rhythm: Normal rate and regular rhythm.      Pulses: Normal pulses.      Heart sounds: Normal heart sounds.   Pulmonary:      Effort: Pulmonary effort is normal.      Breath sounds: Normal breath sounds.   Neurological:      Mental Status: She is alert.         Results for orders placed or performed in visit on 07/23/22 (from the past 4032 hour(s))   COMPREHENSIVE METABOLIC PNL, FASTING   Result Value Ref Range    SODIUM 141 137 -  145 mmol/L    POTASSIUM 4.9 3.5 - 5.1 mmol/L    CHLORIDE 106 98 - 107 mmol/L    CO2 TOTAL 26 22 - 30 mmol/L    ANION GAP 9 5 - 19 mmol/L    BUN 29 (H) 7 - 17 mg/dL    CREATININE 2.95 (H) 0.52 - 1.00 mg/dL    BUN/CREA RATIO 21 (H) 6 - 20    ESTIMATED GFR 40 (L) >60 mL/min/1.32m^2    ALBUMIN 3.9 3.5 - 5.0 g/dL    CALCIUM 9.8 8.4 - 28.4 mg/dL    GLUCOSE 93 74 - 132 mg/dL    ALKALINE PHOSPHATASE 128 (H) 38 - 126 U/L    ALT (SGPT) 14 <35 U/L    AST (SGOT) 30 14 - 36 U/L    BILIRUBIN TOTAL 0.4 0.2 - 1.3 mg/dL    PROTEIN TOTAL 6.8 6.3 - 8.2 g/dL    ALBUMIN/GLOBULIN RATIO 1.3 (L) 1.5 - 2.5    Narrative    Estimated Glomerular Filtration Rate (eGFR) is calculated using the CKD-EPI (2021) equation, intended for patients 70 years of age and older. If gender is not documented or "unknown", there will be no eGFR calculation.   HGA1C (HEMOGLOBIN A1C WITH EST AVG GLUCOSE)   Result Value Ref Range    HEMOGLOBIN A1C 8.2 (H) 4.0 - 6.0 %    ESTIMATED AVERAGE GLUCOSE 189 mg/dL   LIPID PANEL   Result Value Ref Range    CHOLESTEROL 87 0 - 200 mg/dL    TRIGLYCERIDES 440 (H) <150 mg/dL    HDL CHOL 31 (L) >10 mg/dL    LDL CALC 25 <272 mg/dL   Results for orders placed or performed in visit on 04/11/22 (from the past 4032 hour(s))   COMPREHENSIVE METABOLIC PNL, FASTING   Result Value Ref Range    SODIUM 139 137 - 145 mmol/L  POTASSIUM 4.8 3.5 - 5.1 mmol/L    CHLORIDE 106 98 - 107 mmol/L    CO2 TOTAL 24 22 - 30 mmol/L    ANION GAP 9 5 - 19 mmol/L    BUN 30 (H) 7 - 17 mg/dL    CREATININE 1.60 (H) 0.52 - 1.00 mg/dL    BUN/CREA RATIO 28 (H) 6 - 20    ESTIMATED GFR 53 (L) >60 mL/min/1.63m^2    ALBUMIN 3.9 3.5 - 5.0 g/dL    CALCIUM 10.9 8.4 - 32.3 mg/dL    GLUCOSE 557 (H) 74 - 106 mg/dL    ALKALINE PHOSPHATASE 121 38 - 126 U/L    ALT (SGPT) 15 <35 U/L    AST (SGOT) 29 14 - 36 U/L    BILIRUBIN TOTAL 0.4 0.2 - 1.3 mg/dL    PROTEIN TOTAL 6.8 6.3 - 8.2 g/dL    ALBUMIN/GLOBULIN RATIO 1.3 (L) 1.5 - 2.5    Narrative    Estimated Glomerular Filtration  Rate (eGFR) is calculated using the CKD-EPI (2021) equation, intended for patients 3 years of age and older. If gender is not documented or "unknown", there will be no eGFR calculation.   HGA1C (HEMOGLOBIN A1C WITH EST AVG GLUCOSE)   Result Value Ref Range    HEMOGLOBIN A1C 8.5 (H) 4.0 - 6.0 %    ESTIMATED AVERAGE GLUCOSE 197 mg/dL   LIPID PANEL   Result Value Ref Range    CHOLESTEROL 101 0 - 200 mg/dL    TRIGLYCERIDES 322 (H) <150 mg/dL    HDL CHOL 36 (L) >02 mg/dL    LDL CALC 31 <542 mg/dL                   Assessment & Plan:     ENCOUNTER DIAGNOSES     ICD-10-CM   1. Essential hypertension  I10   2. Diabetes mellitus, type 2 (CMS HCC)  E11.9   3. Coronary artery disease involving native coronary artery of native heart without angina pectoris  I25.10   4. Type 2 diabetes mellitus with diabetic neuropathy, with long-term current use of insulin (CMS HCC)  E11.40    Z79.4    Labs ordered for next appt  Increase toujeo to 25 units daily  Continue Humalog for DM  Continue Crestor for lipids  Continue lisinopril, atenolol for HTN  Continue Imdur for CAD  Continue Lyrica and tramadol for neuropathy  Yearly diabetic eye exam  Check BS daily and as needed  counseled on diet and exercise and importance of physical fitness and caloric restriction in minimizing risk for diabetes and other chronic medical conditions.  Dietary Recommendations:  -avoid all sugar in your diet, candy, sugar sweetened beverages, sugary snacks like ice cream or candy bars or other candy  -avoid processed foods when you eat out try to order a healthy meat choice, salad and vegetables avoid eating the bread  -avoid wheat products such as pasta and bread you can substitute with low carb wraps and wild rice or even small amounts of white rice  -focus your plate on healthy protein choices such as meats, tofu, and green veggies  -for snacks consider veggies and small amounts of fruit and popcorn  -drink plenty of water 1/2 oz for every pound of body  weight            Orders Placed This Encounter    MICROALBUMIN/CREATININE RATIO, URINE, RANDOM    COMPREHENSIVE METABOLIC PNL, FASTING    HGA1C (HEMOGLOBIN A1C  WITH EST AVG GLUCOSE)    CBC/DIFF    insulin glargine U-300 conc (TOUJEO MAX U-300 SOLOSTAR) 300 unit/mL (3 mL) Subcutaneous Insulin Pen                    Return in about 3 months (around 11/02/2022) for In Person Visit, annual medicare well.      Colin Benton, DO  08/02/2022, 14:04

## 2022-08-02 NOTE — Progress Notes (Signed)
Molly Howell, [facility]  [phone] [address] [place] [address]            Name: Molly Howell MRN:  [id number]   Date: 08/02/2022 Age: 77 y.o.         Chief Complaint:   Chief Complaint   Patient presents with    Foot Pain    Toe Pain     Pt is here to follow up with the R foot today.        Subjective  Molly Howell is a 77 y.o. female who presents to clinic for follow-up of right foot preulcerative callus.  She relates no pain no redness no drainage.      Objective    On physical examination Molly Howell is seated comfortably in the examination room in no apparent distress.  Alert and oriented to time and place. Mood and affect are normal and appropriate to situation.  She  appears well developed, well nourished, with good attention to hygiene and body habitus.    Skin:  Area of ulceration along the dorsal lateral right 5th toe with thick callus dry intra lesion bleed.  With the with debridement there is no open lesion no ulceration no active bleeding debridement  Musculoskeletal:  Right 5th toe contracted at the IPJ, rigid  Neurological:  Does not respond to pain stimuli right forefoot  Vascular:  Skin temperature is warm to warm proximal to distal right.  CFT is immediate to all toes        Assessment/ Plan  Molly Howell was seen today for foot pain and toe pain.    Diagnoses and all orders for this visit:    Hammer toe of right foot    Type II or unspecified type diabetes mellitus with neurological manifestations, uncontrolled(250.62) (CMS HCC)        1. Today I explained the etiology, prognosis, and treatment options for preulcerative callus right 5th toe secondary to diabetic neuropathy and hammertoe deformity.    2. I discussed treatment options including offloading, callus debridement.    3. Today I recommended continue with accommodative shoes.  I debrided callus to level of comfort using a 15. Blade right 5th toe.  No ulcer bleeding debridement.  Advised her to monitor for  pain redness swelling drainage call with questions or concerns.    4. Follow up in 2-3 months sooner if needed.      Wyn Quaker, DPM        This note was partially created using M*Modal fluency direct system (voice recognition software ) and is inherently subject to errors including those of syntax and "sound- alike" substitutions which may escape proofreading.  In such instances, , original meaning may be extrapolated by contextual derivation.

## 2022-08-02 NOTE — Patient Instructions (Addendum)
Increase Toujou to 25 units daily  Continue current medications   I have ordered labs for your next appointment.  Please have your labs done about 1 week before your next appointment.   Yearly diabetic eye exam  counseled on diet and exercise and importance of physical fitness and caloric restriction in minimizing risk for diabetes and other chronic medical conditions. Dietary Recommendations:  -avoid all sugar in your diet, candy, sugar sweetened beverages, sugary snacks like ice cream or candy bars or other candy  -avoid processed foods when you eat out try to order a healthy meat choice, salad and vegetables avoid eating the bread  -avoid wheat products such as pasta and bread you can substitute with low carb wraps and wild rice or even small amounts of white rice  -focus your plate on healthy protein choices such as meats, tofu, and green veggies  -for snacks consider veggies and small amounts of fruit and popcorn  -drink plenty of water 1/2 oz for every pound of body weight

## 2022-08-03 ENCOUNTER — Other Ambulatory Visit (INDEPENDENT_AMBULATORY_CARE_PROVIDER_SITE_OTHER): Payer: Self-pay | Admitting: Internal Medicine

## 2022-08-04 MED ORDER — TRAMADOL 50 MG TABLET
1.00 | ORAL_TABLET | Freq: Four times a day (QID) | ORAL | 2 refills | Status: AC | PRN
Start: 2022-08-04 — End: ?

## 2022-08-10 ENCOUNTER — Other Ambulatory Visit (INDEPENDENT_AMBULATORY_CARE_PROVIDER_SITE_OTHER): Payer: Self-pay | Admitting: Internal Medicine

## 2022-08-10 MED ORDER — OMEPRAZOLE 40 MG CAPSULE,DELAYED RELEASE
40.0000 mg | DELAYED_RELEASE_CAPSULE | Freq: Every day | ORAL | 3 refills | Status: DC
Start: 2022-08-10 — End: 2022-08-10

## 2022-08-23 ENCOUNTER — Other Ambulatory Visit (INDEPENDENT_AMBULATORY_CARE_PROVIDER_SITE_OTHER): Payer: Self-pay | Admitting: Internal Medicine

## 2022-08-23 MED ORDER — LISINOPRIL 20 MG TABLET
20.0000 mg | ORAL_TABLET | Freq: Every day | ORAL | 3 refills | Status: DC
Start: 2022-08-23 — End: 2022-08-23

## 2022-08-23 MED ORDER — ISOSORBIDE MONONITRATE ER 30 MG TABLET,EXTENDED RELEASE 24 HR
30.0000 mg | ORAL_TABLET | Freq: Every morning | ORAL | 3 refills | Status: DC
Start: 2022-08-23 — End: 2022-08-23

## 2022-09-20 ENCOUNTER — Other Ambulatory Visit (INDEPENDENT_AMBULATORY_CARE_PROVIDER_SITE_OTHER): Payer: Self-pay | Admitting: Internal Medicine

## 2022-09-20 MED ORDER — ALLOPURINOL 100 MG TABLET
100.0000 mg | ORAL_TABLET | Freq: Every day | ORAL | 3 refills | Status: DC
Start: 2022-09-20 — End: 2022-09-20

## 2022-09-23 ENCOUNTER — Other Ambulatory Visit (INDEPENDENT_AMBULATORY_CARE_PROVIDER_SITE_OTHER): Payer: Self-pay | Admitting: Internal Medicine

## 2022-09-26 MED ORDER — PREGABALIN 150 MG CAPSULE
150.0000 mg | ORAL_CAPSULE | Freq: Two times a day (BID) | ORAL | 0 refills | Status: DC
Start: 2022-09-26 — End: 2022-12-20

## 2022-11-02 ENCOUNTER — Other Ambulatory Visit: Payer: Self-pay

## 2022-11-02 ENCOUNTER — Encounter (INDEPENDENT_AMBULATORY_CARE_PROVIDER_SITE_OTHER): Payer: Self-pay | Admitting: Foot & Ankle Surgery

## 2022-11-02 ENCOUNTER — Ambulatory Visit (INDEPENDENT_AMBULATORY_CARE_PROVIDER_SITE_OTHER): Payer: Medicare HMO | Admitting: Foot & Ankle Surgery

## 2022-11-02 DIAGNOSIS — L84 Corns and callosities: Secondary | ICD-10-CM

## 2022-11-02 DIAGNOSIS — E11628 Type 2 diabetes mellitus with other skin complications: Secondary | ICD-10-CM

## 2022-11-02 DIAGNOSIS — M2041 Other hammer toe(s) (acquired), right foot: Secondary | ICD-10-CM

## 2022-11-02 DIAGNOSIS — E1149 Type 2 diabetes mellitus with other diabetic neurological complication: Secondary | ICD-10-CM

## 2022-11-02 NOTE — Progress Notes (Signed)
[name], [facility]  [phone] [place] [address]            Name: Karmina Matelski MRN:  [id number]   Date: 11/02/2022 Age: 77 y.o.         Chief Complaint:   Chief Complaint   Patient presents with    Foot Pain     Pt is here to follow up with the hammertoe of the R foot.        Subjective  Molly Howell is a 77 y.o. female who presents to clinic for follow-up of right foot preulcerative callus.  She relates no pain no redness no drainage.  Wearing diabetic shoes      Objective    On physical examination Samanda Delker is seated comfortably in the examination room in no apparent distress.  Alert and oriented to time and place. Mood and affect are normal and appropriate to situation.  She  appears well developed, well nourished, with good attention to hygiene and body habitus.    Skin:  Area of ulceration along the dorsal lateral right 5th toe with thick callus dry intra lesion bleed.  With the with debridement there is no open lesion no ulceration no active bleeding debridement  Musculoskeletal:  Right 5th toe contracted at the IPJ, rigid  Neurological:  Does not respond to pain stimuli right forefoot  Vascular:  Skin temperature is warm to warm proximal to distal right.  CFT is immediate to all toes        Assessment/ Plan  Ayaana was seen today for foot pain.    Diagnoses and all orders for this visit:    Hammer toe of right foot    Type II or unspecified type diabetes mellitus with neurological manifestations, uncontrolled(250.62) (CMS HCC)        1. Today I explained the etiology, prognosis, and treatment options for preulcerative callus right 5th toe secondary to diabetic neuropathy and hammertoe deformity.    2. I discussed treatment options including offloading, callus debridement.    3. Today I recommended continue with accommodative shoes.  I debrided callus to level of comfort using a 15. Blade right 5th toe.  No ulcer bleeding debridement.  Advised her to  monitor for pain redness swelling drainage call with questions or concerns.    4. Follow up in 2-3 months sooner if needed.      Wyn Quaker, DPM        This note was partially created using M*Modal fluency direct system (voice recognition software ) and is inherently subject to errors including those of syntax and "sound- alike" substitutions which may escape proofreading.  In such instances, , original meaning may be extrapolated by contextual derivation.

## 2022-11-05 ENCOUNTER — Ambulatory Visit: Payer: Medicare HMO | Attending: Internal Medicine

## 2022-11-05 ENCOUNTER — Other Ambulatory Visit: Payer: Self-pay

## 2022-11-05 DIAGNOSIS — E119 Type 2 diabetes mellitus without complications: Secondary | ICD-10-CM | POA: Insufficient documentation

## 2022-11-05 DIAGNOSIS — I10 Essential (primary) hypertension: Secondary | ICD-10-CM | POA: Insufficient documentation

## 2022-11-05 LAB — COMPREHENSIVE METABOLIC PNL, FASTING
ALBUMIN/GLOBULIN RATIO: 1.4 — ABNORMAL LOW (ref 1.5–2.5)
ALBUMIN: 3.6 g/dL (ref 3.5–5.0)
ALKALINE PHOSPHATASE: 176 U/L — ABNORMAL HIGH (ref 38–126)
ALT (SGPT): 30 U/L (ref <35)
ANION GAP: 12 mmol/L (ref 5–19)
AST (SGOT): 44 U/L — ABNORMAL HIGH (ref 14–36)
BILIRUBIN TOTAL: 0.2 mg/dL (ref 0.2–1.3)
BUN/CREA RATIO: 22 — ABNORMAL HIGH (ref 6–20)
BUN: 21 mg/dL — ABNORMAL HIGH (ref 7–17)
CALCIUM: 9.3 mg/dL (ref 8.4–10.2)
CHLORIDE: 111 mmol/L — ABNORMAL HIGH (ref 98–107)
CO2 TOTAL: 20 mmol/L — ABNORMAL LOW (ref 22–30)
CREATININE: 0.96 mg/dL (ref 0.52–1.00)
ESTIMATED GFR: >60 mL/min/{1.73_m2} (ref >60)
GLUCOSE: 143 mg/dL — ABNORMAL HIGH (ref 74–106)
POTASSIUM: 4.5 mmol/L (ref 3.5–5.1)
PROTEIN TOTAL: 6.2 g/dL — ABNORMAL LOW (ref 6.3–8.2)
SODIUM: 143 mmol/L (ref 137–145)

## 2022-11-05 LAB — CBC WITH DIFF
BASOPHIL #: 0.1 10*3/uL (ref 0.00–0.20)
BASOPHIL %: 1 % (ref 0–2)
EOSINOPHIL #: 0.4 10*3/uL (ref 0.00–0.60)
EOSINOPHIL %: 7 % — ABNORMAL HIGH (ref 0–5)
HCT: 34.5 % — ABNORMAL LOW (ref 36.0–48.0)
HGB: 11.4 g/dL — ABNORMAL LOW (ref 11.6–14.8)
LYMPHOCYTE #: 2.1 10*3/uL (ref 1.10–3.80)
LYMPHOCYTE %: 34 % (ref 19–46)
MCH: 29.5 pg (ref 24.4–34.0)
MCHC: 33 g/dL (ref 30.0–37.0)
MCV: 89.6 fL (ref 80.0–100.0)
MONOCYTE #: 0.5 10*3/uL (ref 0.10–0.80)
MONOCYTE %: 8 % (ref 4–12)
MPV: 8.4 fL (ref 7.5–11.5)
NEUTROPHIL #: 3.1 10*3/uL (ref 1.80–7.50)
NEUTROPHIL %: 51 % (ref 41–69)
PLATELETS: 184 10*3/uL (ref 130–400)
RBC: 3.85 10*6/uL (ref 3.50–5.50)
RDW: 16.7 % — ABNORMAL HIGH (ref 11.5–14.0)
WBC: 6.2 10*3/uL (ref 4.5–11.5)

## 2022-11-05 LAB — HGA1C (HEMOGLOBIN A1C WITH EST AVG GLUCOSE)
ESTIMATED AVERAGE GLUCOSE: 163 mg/dL
HEMOGLOBIN A1C: 7.3 % — ABNORMAL HIGH (ref 4.0–6.0)

## 2022-11-08 ENCOUNTER — Other Ambulatory Visit: Payer: Self-pay

## 2022-11-09 ENCOUNTER — Other Ambulatory Visit: Payer: Self-pay

## 2022-11-10 ENCOUNTER — Ambulatory Visit (INDEPENDENT_AMBULATORY_CARE_PROVIDER_SITE_OTHER): Payer: Self-pay | Admitting: Internal Medicine

## 2022-11-10 ENCOUNTER — Ambulatory Visit: Payer: Medicare HMO | Attending: Internal Medicine | Admitting: Internal Medicine

## 2022-11-10 ENCOUNTER — Encounter (INDEPENDENT_AMBULATORY_CARE_PROVIDER_SITE_OTHER): Payer: Self-pay | Admitting: Internal Medicine

## 2022-11-10 ENCOUNTER — Other Ambulatory Visit: Payer: Self-pay

## 2022-11-10 ENCOUNTER — Other Ambulatory Visit (INDEPENDENT_AMBULATORY_CARE_PROVIDER_SITE_OTHER): Payer: Self-pay | Admitting: Internal Medicine

## 2022-11-10 VITALS — BP 174/76 | HR 72 | Ht 63.0 in | Wt 147.0 lb

## 2022-11-10 DIAGNOSIS — E1140 Type 2 diabetes mellitus with diabetic neuropathy, unspecified: Secondary | ICD-10-CM | POA: Insufficient documentation

## 2022-11-10 DIAGNOSIS — M109 Gout, unspecified: Secondary | ICD-10-CM | POA: Insufficient documentation

## 2022-11-10 DIAGNOSIS — Z794 Long term (current) use of insulin: Secondary | ICD-10-CM | POA: Insufficient documentation

## 2022-11-10 DIAGNOSIS — I2510 Atherosclerotic heart disease of native coronary artery without angina pectoris: Secondary | ICD-10-CM | POA: Insufficient documentation

## 2022-11-10 DIAGNOSIS — I10 Essential (primary) hypertension: Secondary | ICD-10-CM | POA: Insufficient documentation

## 2022-11-10 DIAGNOSIS — Z79899 Other long term (current) drug therapy: Secondary | ICD-10-CM | POA: Insufficient documentation

## 2022-11-10 DIAGNOSIS — Z23 Encounter for immunization: Secondary | ICD-10-CM | POA: Insufficient documentation

## 2022-11-10 DIAGNOSIS — Z0000 Encounter for general adult medical examination without abnormal findings: Secondary | ICD-10-CM | POA: Insufficient documentation

## 2022-11-10 DIAGNOSIS — Z87891 Personal history of nicotine dependence: Secondary | ICD-10-CM | POA: Insufficient documentation

## 2022-11-10 DIAGNOSIS — G5603 Carpal tunnel syndrome, bilateral upper limbs: Secondary | ICD-10-CM | POA: Insufficient documentation

## 2022-11-10 DIAGNOSIS — R531 Weakness: Secondary | ICD-10-CM | POA: Insufficient documentation

## 2022-11-10 MED ORDER — ROSUVASTATIN 10 MG TABLET
10.0000 mg | ORAL_TABLET | Freq: Every evening | ORAL | 1 refills | Status: AC
Start: 2022-11-10 — End: 2023-05-09

## 2022-11-10 MED ORDER — TRAMADOL 50 MG TABLET
1.0000 | ORAL_TABLET | Freq: Four times a day (QID) | ORAL | 2 refills | Status: DC | PRN
Start: 2022-11-10 — End: 2023-02-07

## 2022-11-10 NOTE — Nursing Note (Signed)
Immunization administered       Name Date Dose VIS Date Route    Influenza Vaccine, 65+ 11/10/2022 0.5 mL 09/13/2019 Intramuscular    Site: Right deltoid    Given By: Beatris Si, CMA    Manufacturer: [employer]    Lot: [id number]    NDC: [id number]             Beatris Si, CMA  11/10/2022, 14:44

## 2022-11-10 NOTE — Patient Instructions (Addendum)
Continue current medications   I have ordered labs for your next appointment.  Please have your labs done about 1 week before your next appointment.   Yearly diabetic eye exam  Check BS daily and as needed  counseled on diet and exercise and importance of physical fitness and caloric restriction in minimizing risk for diabetes and other chronic medical conditions. Dietary Recommendations:  -avoid all sugar in your diet, candy, sugar sweetened beverages, sugary snacks like ice cream or candy bars or other candy  -avoid processed foods when you eat out try to order a healthy meat choice, salad and vegetables avoid eating the bread  -avoid wheat products such as pasta and bread you can substitute with low carb wraps and wild rice or even small amounts of white rice  -focus your plate on healthy protein choices such as meats, tofu, and green veggies  -for snacks consider veggies and small amounts of fruit and popcorn  -drink plenty of water 1/2 oz for every pound of body weight       Medicare Preventive Services  Medicare coverage information Recommendation for YOU   Heart Disease and Diabetes   Lipid profile Every 5 years or more often if at risk for cardiovascular disease     Lab Results   Component Value Date    CHOLESTEROL 87 07/23/2022    HDLCHOL 31 (L) 07/23/2022    LDLCHOL 25 07/23/2022    TRIG 155 (H) 07/23/2022           Diabetes Screening    Yearly for those at risk for diabetes, 2 tests per year for those with prediabetes Last Glucose: 143    Diabetes Self Management Training or Medical Nutrition Therapy  For those with diabetes, up to 10 hrs initial training within a year, subsequent years up to 2 hrs of follow up training Optional for those with diabetes     Medical Nutrition Therapy  Three hours of one-on-one counseling in first year, two hours in subsequent years Optional for those with diabetes, kidney disease   Intensive Behavioral Therapy for Obesity  Face-to-face counseling, first month every week,  month 2-6 every other week, month 7-12 every month if continued progress is documented Optional for those with Body Mass Index 30 or higher  Your There is no height or weight on file to calculate BMI.   Tobacco Cessation (Quitting) Counseling   Covers up to 8 smoking and tobacco-use cessation counseling sessions in a 55-month period.    Optional for those that use tobacco   Cancer Screening Last Completion Date   Colorectal screening   For anyone age 14 to 19 or any age if high risk:  Screening Colonoscopy every 10 yrs if low risk,  more frequent if higher risk  OR  Cologuard Stool DNA test once every 3 years OR  Fecal Occult Blood Testing yearly OR  Flexible  Sigmoidoscopy  every 5 yr OR  CT Colonography every 5 yrs      See below for due date if applicable.   Screening Pap Test   Recommended every 3 years for all women age 54 to 56, or every five years if combined with HPV test (routine screening not needed after total hysterectomy).  Medicare covers every 2 years or yearly if high risk.  Screening Pelvic Exam   Medicare covers every 2 years, yearly if high risk or childbearing age with abnormal Pap in last 3 yrs.     See below for due date if  applicable.   Screening Mammogram   Recommended every 2 years for women age 99 to 49, or more frequent if you have a higher risk. Selectively recommended for women between 40-49 based on shared decisions about risk. Covered by Medicare up to every year for women age 74 or older   See below for due date if applicable.         Lung Cancer Screening  Annual low dose computed tomography (LDCT scan) is recommended for those age 55-80 who smoked 20 pack-years and are current smokers or quit smoking within past 15 years, after counseling by your doctor or nurse clinician about the possible benefits or harms.     See below for due date if applicable.   Vaccinations   Respiratory syncytial virus (RSV)  Age 59 years or older: Based on shared clinical decision-making with your  provider.  Pneumococcal Vaccine  Recommended routinely age 43+ with one or two separate vaccines based on your risk. Recommended before age 53 if medical conditions with increased risk  Seasonal Influenza Vaccine  Once every flu season   Hepatitis B Vaccine  3 doses if risk (including anyone with diabetes or liver disease)  Shingles Vaccine  Two doses at age 41 or older  Diphtheria Tetanus Pertussis Vaccine  ONCE as adult, booster every 10 years     Immunization History   Administered Date(s) Administered    Covid-19 Vaccine,Moderna Bivalent 12/02/2020    Covid-19 Vaccine,Pfizer-BioNTech,Purple Top,1yrs+ 03/09/2019, 03/27/2019, 04/06/2019, 11/08/2019, 11/21/2019    Diptheria & Tetanus Toxoid,Adsorbed, 72YRS & OLDER 12/14/2020    HEPATITIS A VACCINE -ADULT 10/08/2015    Influenza Vaccine, 65+ 12/05/2019    PREVNAR 13 10/15/2013, 11/29/2015    PREVNAR 20 02/09/2021    Typhoid Vaccine Oral-2 BL unit cap (ADMIN) 10/15/2015    ZOSTAVAX (VARICELLA ZOSTER VACCINE) 08/07/2017, 11/06/2017, 12/08/2018     Shingles vaccine and Diphtheria Tetanus Pertussis vaccines are available at pharmacies or local health department without a prescription.   Other Preventative Screening  Last Completion Date   Bone Densitometry   Screening: All females ages 71 and older every 10 years if initial screening normal. Postmenopausal women ages 56-64 need screening with one or more risk factor: previous fracture, parental hip fracture, current smoker, low body weight, excessive alcohol use, Rheumatoid Arthritis   For women with diagnosed Osteoporosis, follow up is recommended every 2 years or a frequency recommended by your provider.       See below for due date if applicable.     Glaucoma Screening   Yearly if in high risk group such as diabetes, family history, African American age 43+ or Hispanic American age 43+   See your eye care provider for screening.   Hepatitis C Screening   Recommended  for those born between ages 18-79 years.     See  below for due date if applicable.     HIV Testing  Recommended routinely at least ONCE, covered every year for age 57 to 63 regardless of risk, and every year for age over 79 who ask for the test or higher risk. Yearly or up to 3 times in pregnancy         See below for due date if applicable.   Abdominal Aortic Aneurysm Screening Ultrasound   Once with a family history of abdominal aortic aneurysms OR a female between 55-75 and have smoked at least 100 cigarettes in your lifetime.         See below for due date  if applicable.       Your Personalized Schedule for Preventive Tests     Health Maintenance: Pending and Last Completed         Date Due Completion Date    Osteoporosis screening Never done ---    Hepatitis C screening Never done ---    Shingles Vaccine (2 of 3) 02/02/2019 12/08/2018    Influenza Vaccine (1) 10/09/2022 12/05/2019    Covid-19 Vaccine (7 - 2023-24 season) 10/09/2022 12/02/2020    Diabetic Kidney Health Microalb/Cr Ratio 12/12/2022 12/11/2021    Diabetic A1C 05/05/2023 11/05/2022    Diabetic Retinal Exam 07/13/2023 07/13/2022    Depression Screening 08/02/2023 08/02/2022    Diabetic Kidney Health eGFR 11/05/2023 11/05/2022    Medicare Annual Wellness Visit 11/10/2023 11/10/2022    Adult Tdap-Td (1 - Tdap) 12/15/2030 12/14/2020               Non-Opioid Treatment for Chronic Pains     Treatment for chronic pain can be managed without opioids. Below are non-opioid options that may be considered and discussed with your provider to determine which options would be best for your health.    Over the counter or presciptions medications:  Acetaminophen (Tylenol) or Non-steroidal anti-inflammatories such as: Ibuprofen (Motrin, Advil), naproxen (Aleve), aspirin  Antidepressants such as amitriptyline, nortriptyline (Pamelor),  Doxepin (Silenor), Imipramine (Tofranil) and others.  Anticonvulsant Nerve pain medications: Gabapentin (Neurontin), pregabalin (Lyrica)  Externally applied medications such as NSAID'S,  lidocaine, capsaisin, and others  Injections: pain specialists can sometimes inject medications at the site of pain.    Alternative therapies such as  Acupuncture  Osteopathic manipulation  Chiropractic  Massage therapy       For Information on Advanced Directives for Health Care:  :  [email or web address]  [place], [place], [place], [place] General Information: [email or web address]

## 2022-12-08 ENCOUNTER — Telehealth (INDEPENDENT_AMBULATORY_CARE_PROVIDER_SITE_OTHER): Payer: Self-pay | Admitting: Internal Medicine

## 2022-12-08 ENCOUNTER — Other Ambulatory Visit (INDEPENDENT_AMBULATORY_CARE_PROVIDER_SITE_OTHER): Payer: Self-pay | Admitting: Internal Medicine

## 2022-12-08 NOTE — Telephone Encounter (Signed)
Patient lvm for request of Indomethacin. Attempted to call patient back but no answer or vmbx.

## 2022-12-08 NOTE — Telephone Encounter (Signed)
Mistake opened in error

## 2022-12-15 ENCOUNTER — Other Ambulatory Visit: Payer: Self-pay

## 2022-12-20 ENCOUNTER — Other Ambulatory Visit (INDEPENDENT_AMBULATORY_CARE_PROVIDER_SITE_OTHER): Payer: Self-pay | Admitting: Internal Medicine

## 2022-12-21 ENCOUNTER — Other Ambulatory Visit (INDEPENDENT_AMBULATORY_CARE_PROVIDER_SITE_OTHER): Payer: Self-pay | Admitting: Internal Medicine

## 2022-12-21 MED ORDER — PREGABALIN 150 MG CAPSULE
150.0000 mg | ORAL_CAPSULE | Freq: Two times a day (BID) | ORAL | 0 refills | Status: DC
Start: 2022-12-21 — End: 2023-03-23

## 2022-12-21 NOTE — Telephone Encounter (Signed)
Refill for Lyrica requested.

## 2022-12-30 ENCOUNTER — Other Ambulatory Visit (INDEPENDENT_AMBULATORY_CARE_PROVIDER_SITE_OTHER): Payer: Self-pay | Admitting: Internal Medicine

## 2022-12-30 MED ORDER — INSULIN GLARGINE (U-300) CONC. 300 UNIT/ML (3 ML) SUBCUTANEOUS PEN
30.0000 [IU] | PEN_INJECTOR | Freq: Every day | SUBCUTANEOUS | 5 refills | Status: DC
Start: 2022-12-30 — End: 2023-02-21

## 2023-01-04 ENCOUNTER — Other Ambulatory Visit: Payer: Self-pay

## 2023-01-04 ENCOUNTER — Other Ambulatory Visit (INDEPENDENT_AMBULATORY_CARE_PROVIDER_SITE_OTHER): Payer: Self-pay | Admitting: Internal Medicine

## 2023-01-04 DIAGNOSIS — E1140 Type 2 diabetes mellitus with diabetic neuropathy, unspecified: Secondary | ICD-10-CM

## 2023-01-04 MED ORDER — INSULIN LISPRO (U-100) 100 UNIT/ML SUBCUTANEOUS SOLUTION
15.0000 [IU] | Freq: Two times a day (BID) | SUBCUTANEOUS | 1 refills | Status: DC
Start: 2023-01-04 — End: 2023-06-28

## 2023-01-04 NOTE — Telephone Encounter (Signed)
[facility].

## 2023-01-16 ENCOUNTER — Other Ambulatory Visit (INDEPENDENT_AMBULATORY_CARE_PROVIDER_SITE_OTHER): Payer: Self-pay | Admitting: Internal Medicine

## 2023-01-16 DIAGNOSIS — M109 Gout, unspecified: Secondary | ICD-10-CM

## 2023-01-16 MED ORDER — ATENOLOL 25 MG TABLET
25.0000 mg | ORAL_TABLET | Freq: Every day | ORAL | 1 refills | Status: DC
Start: 2023-01-16 — End: 2023-07-13

## 2023-01-16 MED ORDER — COLCHICINE 0.6 MG TABLET
0.6000 mg | ORAL_TABLET | Freq: Every day | ORAL | 1 refills | Status: DC
Start: 2023-01-16 — End: 2023-07-13

## 2023-02-06 ENCOUNTER — Ambulatory Visit (HOSPITAL_COMMUNITY): Payer: Self-pay

## 2023-02-07 ENCOUNTER — Other Ambulatory Visit (INDEPENDENT_AMBULATORY_CARE_PROVIDER_SITE_OTHER): Payer: Self-pay | Admitting: Internal Medicine

## 2023-02-09 ENCOUNTER — Other Ambulatory Visit (INDEPENDENT_AMBULATORY_CARE_PROVIDER_SITE_OTHER): Payer: Self-pay | Admitting: Internal Medicine

## 2023-02-09 MED ORDER — TRAMADOL 50 MG TABLET
1.0000 | ORAL_TABLET | Freq: Four times a day (QID) | ORAL | 2 refills | Status: DC | PRN
Start: 2023-02-09 — End: 2023-04-19

## 2023-02-14 ENCOUNTER — Encounter (INDEPENDENT_AMBULATORY_CARE_PROVIDER_SITE_OTHER): Payer: Self-pay | Admitting: Foot & Ankle Surgery

## 2023-02-14 ENCOUNTER — Other Ambulatory Visit (INDEPENDENT_AMBULATORY_CARE_PROVIDER_SITE_OTHER): Payer: Self-pay | Admitting: Internal Medicine

## 2023-02-18 ENCOUNTER — Ambulatory Visit: Payer: Medicare HMO | Attending: Internal Medicine

## 2023-02-18 ENCOUNTER — Other Ambulatory Visit: Payer: Self-pay

## 2023-02-18 DIAGNOSIS — I10 Essential (primary) hypertension: Secondary | ICD-10-CM | POA: Insufficient documentation

## 2023-02-18 DIAGNOSIS — E119 Type 2 diabetes mellitus without complications: Secondary | ICD-10-CM | POA: Insufficient documentation

## 2023-02-18 DIAGNOSIS — I2510 Atherosclerotic heart disease of native coronary artery without angina pectoris: Secondary | ICD-10-CM | POA: Insufficient documentation

## 2023-02-18 DIAGNOSIS — E1140 Type 2 diabetes mellitus with diabetic neuropathy, unspecified: Secondary | ICD-10-CM | POA: Insufficient documentation

## 2023-02-18 DIAGNOSIS — Z794 Long term (current) use of insulin: Secondary | ICD-10-CM | POA: Insufficient documentation

## 2023-02-18 LAB — LIPID PANEL
CHOLESTEROL: 84 mg/dL (ref 0–200)
HDL CHOL: 34 mg/dL — ABNORMAL LOW (ref >40)
LDL CALC: 28 mg/dL (ref <100)
TRIGLYCERIDES: 110 mg/dL (ref <150)

## 2023-02-18 LAB — HGA1C (HEMOGLOBIN A1C WITH EST AVG GLUCOSE)
ESTIMATED AVERAGE GLUCOSE: 186 mg/dL
HEMOGLOBIN A1C: 8.1 % — ABNORMAL HIGH (ref 4.0–6.0)

## 2023-02-18 LAB — COMPREHENSIVE METABOLIC PNL, FASTING
ALBUMIN/GLOBULIN RATIO: 1.4 — ABNORMAL LOW (ref 1.5–2.5)
ALBUMIN: 3.9 g/dL (ref 3.5–5.0)
ALKALINE PHOSPHATASE: 169 U/L — ABNORMAL HIGH (ref 38–126)
ALT (SGPT): 17 U/L (ref <35)
ANION GAP: 10 mmol/L (ref 5–19)
AST (SGOT): 30 U/L (ref 14–36)
BILIRUBIN TOTAL: 0.5 mg/dL (ref 0.2–1.3)
BUN/CREA RATIO: 21 — ABNORMAL HIGH (ref 6–20)
BUN: 24 mg/dL — ABNORMAL HIGH (ref 7–17)
CALCIUM: 9.1 mg/dL (ref 8.4–10.2)
CHLORIDE: 102 mmol/L (ref 98–107)
CO2 TOTAL: 26 mmol/L (ref 22–30)
CREATININE: 1.13 mg/dL — ABNORMAL HIGH (ref 0.52–1.00)
ESTIMATED GFR: 50 mL/min/{1.73_m2} — ABNORMAL LOW (ref >60)
GLUCOSE: 263 mg/dL — ABNORMAL HIGH (ref 74–106)
POTASSIUM: 4.9 mmol/L (ref 3.5–5.1)
PROTEIN TOTAL: 6.7 g/dL (ref 6.3–8.2)
SODIUM: 138 mmol/L (ref 137–145)

## 2023-02-18 LAB — MICROALBUMIN/CREATININE RATIO, URINE, RANDOM
CREATININE RANDOM URINE: 127 mg/dL
MICROALBUMIN RANDOM URINE: 128.9 mg/dL — ABNORMAL HIGH (ref 0.0–1.7)
MICROALBUMIN/CREATININE RATIO RANDOM URINE: 1015 mg/g — ABNORMAL HIGH (ref 0.0–29.9)

## 2023-02-21 ENCOUNTER — Encounter (INDEPENDENT_AMBULATORY_CARE_PROVIDER_SITE_OTHER): Payer: Self-pay | Admitting: Internal Medicine

## 2023-02-21 ENCOUNTER — Ambulatory Visit: Payer: Medicare HMO | Attending: Internal Medicine | Admitting: Internal Medicine

## 2023-02-21 ENCOUNTER — Other Ambulatory Visit: Payer: Self-pay

## 2023-02-21 ENCOUNTER — Other Ambulatory Visit: Payer: Self-pay | Admitting: Internal Medicine

## 2023-02-21 VITALS — BP 118/60 | HR 84 | Resp 16 | Ht 63.0 in | Wt 155.0 lb

## 2023-02-21 DIAGNOSIS — Z87891 Personal history of nicotine dependence: Secondary | ICD-10-CM

## 2023-02-21 DIAGNOSIS — H35341 Macular cyst, hole, or pseudohole, right eye: Secondary | ICD-10-CM

## 2023-02-21 DIAGNOSIS — I10 Essential (primary) hypertension: Secondary | ICD-10-CM | POA: Insufficient documentation

## 2023-02-21 DIAGNOSIS — Z794 Long term (current) use of insulin: Secondary | ICD-10-CM | POA: Insufficient documentation

## 2023-02-21 DIAGNOSIS — E1140 Type 2 diabetes mellitus with diabetic neuropathy, unspecified: Secondary | ICD-10-CM | POA: Insufficient documentation

## 2023-02-21 DIAGNOSIS — Z01818 Encounter for other preprocedural examination: Secondary | ICD-10-CM | POA: Insufficient documentation

## 2023-02-21 LAB — URINALYSIS, MACRO/MICRO
BILIRUBIN: NEGATIVE mg/dL
BLOOD: NEGATIVE mg/dL
GLUCOSE: NEGATIVE mg/dL
KETONES: NEGATIVE mg/dL
NITRITE: NEGATIVE
PH: 7 (ref 5.0–9.0)
PROTEIN: 70 mg/dL — AB
SPECIFIC GRAVITY: 1.014 (ref 1.003–1.035)
UROBILINOGEN: <2 mg/dL (ref <2.0)

## 2023-02-21 LAB — MICRO HOLD

## 2023-02-21 MED ORDER — INSULIN GLARGINE (U-300) CONC. 300 UNIT/ML (1.5 ML) SUBCUTANEOUS PEN
40.0000 [IU] | PEN_INJECTOR | Freq: Every evening | SUBCUTANEOUS | 5 refills | Status: DC
Start: 2023-02-21 — End: 2023-08-17

## 2023-02-21 NOTE — Patient Instructions (Signed)
Increase toujeo to 40 units at night  Continue current medications   I have ordered labs for your next appointment.  Please have your labs done about 1 week before your next appointment.   Yearly diabetic eye exam

## 2023-02-21 NOTE — Progress Notes (Addendum)
FAMILY MEDICINE, [facility] [address]  [address]  [place] [address]  Phone: [phone]  Fax: [phone]    Encounter Date: 02/21/2023    Patient ID:  Molly Howell  EPP:[id number]    DOB: [birth year]  Age: 78 y.o. female    There are no exam notes on file for this visit.  Subjective:     Chief Complaint   Patient presents with    Diabetes Follow up     No new concerns.     Pt here for 3 month f/u.  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  She needs an EKG prior to surgery. She is taking tramadol BID for DM neuropathy.     Diabetes   Controlled-no   Checking BS at home-yes   Eye exam-up to date  Hypertension-   Controlled-yes   Checking BP at home-yes          Current Outpatient Medications   Medication Sig    allopurinoL (ZYLOPRIM) 100 mg Oral Tablet Take 1 Tablet (100 mg total) by mouth Once a day for 360 days    aspirin 325 mg Oral Tablet Take 1 Tablet (325 mg total) by mouth Once a day    atenoloL (TENORMIN) 25 mg Oral Tablet Take 1 Tablet (25 mg total) by mouth Once a day    cholecalciferol, vitamin D3, 25 mcg (1,000 unit) Oral Tablet Take 1 Tablet (1,000 Units total) by mouth Once a day    colchicine 0.6 mg Oral Tablet Take 1 Tablet (0.6 mg total) by mouth Once a day for 180 days    cyanocobalamin, vitamin B-12, 5,000 mcg Sublingual Tablet, Sublingual Place under the tongue    flash glucose sensor (FREESTYLE LIBRE 2 SENSOR) Does not apply Kit APPLY EVERY 14 DAYS    Ibuprofen (MOTRIN) 600 mg Oral Tablet Take 1 Tablet (600 mg total) by mouth Every 8 hours as needed for Pain    insulin glargine U-300 conc (TOUJEO SOLOSTAR U-300 INSULIN) 300 unit/mL (1.5 mL) Subcutaneous Insulin Pen Inject 40 Units under the skin Every night    insulin lispro 100 unit/mL Subcutaneous Solution Inject 15 Units under the skin Twice a day before meals for 180 days Inject 5 Units under the skin at [place].    isosorbide mononitrate (IMDUR) 30 mg Oral Tablet Sustained Release 24 hr Take 1  Tablet (30 mg total) by mouth Every morning    lisinopriL (PRINIVIL) 20 mg Oral Tablet Take 1 Tablet (20 mg total) by mouth Once a day    magnesium oxide (MAG-OX) 400 mg Oral Tablet Take 1 Tablet (400 mg total) by mouth Once a day    multivit-minerals/folic acid (MULTIVITAMIN GUMMIES ORAL) Take 1 Tablet by mouth Once a day    omeprazole (PRILOSEC) 40 mg Oral Capsule, Delayed Release(E.C.) Take 1 Capsule (40 mg total) by mouth Once a day    pregabalin (LYRICA) 150 mg Oral Capsule Take 1 Capsule (150 mg total) by mouth Twice daily    rosuvastatin (CRESTOR) 10 mg Oral Tablet Take 1 Tablet (10 mg total) by mouth Every evening for 180 days    traMADoL (ULTRAM) 50 mg Oral Tablet Take 1 Tablet (50 mg total) by mouth Every 6 hours as needed for Pain     No Known Allergies  Past Medical History:   Diagnosis Date    Diabetes mellitus, type 2 (CMS HCC)     Gout, unspecified     Hypertension  Social History     Tobacco Use    Smoking status: Former     Current packs/day: 1.00     Types: Cigarettes    Smokeless tobacco: Never   Vaping Use    Vaping status: Never Used   Substance Use Topics    Alcohol use: Never    Drug use: Never         Review of Systems   Constitutional: Negative.    Respiratory: Negative.     Cardiovascular: Negative.    Neurological:  Positive for numbness.     Objective:    Vitals: BP 118/60 (Site: Right Arm, Patient Position: Sitting, Cuff Size: Adult)   Pulse 84   Resp 16   Ht 1.6 m (5\' 3" )   Wt 70.3 kg (155 lb)   SpO2 98%   BMI 27.46 kg/m           Physical Exam  Vitals and nursing note reviewed.   Constitutional:       Appearance: She is obese.   Cardiovascular:      Rate and Rhythm: Normal rate and regular rhythm.      Pulses: Normal pulses.      Heart sounds: Normal heart sounds.   Pulmonary:      Effort: Pulmonary effort is normal.      Breath sounds: Normal breath sounds.   Neurological:      Mental Status: She is alert.      Sensory: Sensory deficit present.         Results for  orders placed or performed in visit on 02/18/23 (from the past 4032 hour(s))   COMPREHENSIVE METABOLIC PNL, FASTING   Result Value Ref Range    SODIUM 138 137 - 145 mmol/L    POTASSIUM 4.9 3.5 - 5.1 mmol/L    CHLORIDE 102 98 - 107 mmol/L    CO2 TOTAL 26 22 - 30 mmol/L    ANION GAP 10 5 - 19 mmol/L    BUN 24 (H) 7 - 17 mg/dL    CREATININE 6.57 (H) 0.52 - 1.00 mg/dL    BUN/CREA RATIO 21 (H) 6 - 20    ESTIMATED GFR 50 (L) >60 mL/min/1.14m^2    ALBUMIN 3.9 3.5 - 5.0 g/dL    CALCIUM 9.1 8.4 - 84.6 mg/dL    GLUCOSE 962 (H) 74 - 106 mg/dL    ALKALINE PHOSPHATASE 169 (H) 38 - 126 U/L    ALT (SGPT) 17 <35 U/L    AST (SGOT) 30 14 - 36 U/L    BILIRUBIN TOTAL 0.5 0.2 - 1.3 mg/dL    PROTEIN TOTAL 6.7 6.3 - 8.2 g/dL    ALBUMIN/GLOBULIN RATIO 1.4 (L) 1.5 - 2.5    Narrative    Estimated Glomerular Filtration Rate (eGFR) is calculated using the CKD-EPI (2021) equation, intended for patients 95 years of age and older. If gender is not documented or "unknown", there will be no eGFR calculation.   HGA1C (HEMOGLOBIN A1C WITH EST AVG GLUCOSE)   Result Value Ref Range    HEMOGLOBIN A1C 8.1 (H) 4.0 - 6.0 %    ESTIMATED AVERAGE GLUCOSE 186 mg/dL   LIPID PANEL   Result Value Ref Range    CHOLESTEROL 84 0 - 200 mg/dL    TRIGLYCERIDES 952 <841 mg/dL    HDL CHOL 34 (L) >32 mg/dL    LDL CALC 28 <440 mg/dL   URINALYSIS WITH REFLEX MICROSCOPIC AND CULTURE IF POSITIVE    Specimen: Urine, Clean Catch  Narrative    The following orders were created for panel order URINALYSIS WITH REFLEX MICROSCOPIC AND CULTURE IF POSITIVE.  Procedure                               Abnormality         Status                     ---------                               -----------         ------                     URINALYSIS, MACRO/MICRO[[id number]]                                                       Please view results for these tests on the individual orders.   Results for orders placed or performed in visit on 11/05/22 (from the past 4032 hour(s))    MICROALBUMIN/CREATININE RATIO, URINE, RANDOM   Result Value Ref Range    CREATININE RANDOM URINE 127 No Reference Range Established mg/dL    MICROALBUMIN RANDOM URINE 128.9 (H) 0.0 - 1.7 mg/dL    MICROALBUMIN/CREATININE RATIO RANDOM URINE 1,015.0 (H) 0.0 - 29.9 mg/g   COMPREHENSIVE METABOLIC PNL, FASTING   Result Value Ref Range    SODIUM 143 137 - 145 mmol/L    POTASSIUM 4.5 3.5 - 5.1 mmol/L    CHLORIDE 111 (H) 98 - 107 mmol/L    CO2 TOTAL 20 (L) 22 - 30 mmol/L    ANION GAP 12 5 - 19 mmol/L    BUN 21 (H) 7 - 17 mg/dL    CREATININE 1.61 0.96 - 1.00 mg/dL    BUN/CREA RATIO 22 (H) 6 - 20    ESTIMATED GFR >60 >60 mL/min/1.6m^2    ALBUMIN 3.6 3.5 - 5.0 g/dL    CALCIUM 9.3 8.4 - 04.5 mg/dL    GLUCOSE 409 (H) 74 - 106 mg/dL    ALKALINE PHOSPHATASE 176 (H) 38 - 126 U/L    ALT (SGPT) 30 <35 U/L    AST (SGOT) 44 (H) 14 - 36 U/L    BILIRUBIN TOTAL 0.2 0.2 - 1.3 mg/dL    PROTEIN TOTAL 6.2 (L) 6.3 - 8.2 g/dL    ALBUMIN/GLOBULIN RATIO 1.4 (L) 1.5 - 2.5    Narrative    Estimated Glomerular Filtration Rate (eGFR) is calculated using the CKD-EPI (2021) equation, intended for patients 23 years of age and older. If gender is not documented or "unknown", there will be no eGFR calculation.   HGA1C (HEMOGLOBIN A1C WITH EST AVG GLUCOSE)   Result Value Ref Range    HEMOGLOBIN A1C 7.3 (H) 4.0 - 6.0 %    ESTIMATED AVERAGE GLUCOSE 163 mg/dL   CBC/DIFF    Narrative    The following orders were created for panel order CBC/DIFF.  Procedure                               Abnormality         Status                     ---------                               -----------         ------  CBC WITH BJYN[[id number]]                Abnormal            Final result                 Please view results for these tests on the individual orders.   CBC WITH DIFF   Result Value Ref Range    WBC 6.2 4.5 - 11.5 x10^3/uL    RBC 3.85 3.50 - 5.50 x10^6/uL    HGB 11.4 (L) 11.6 - 14.8 g/dL    HCT 86.5 (L) 78.4 - 48.0 %    MCV 89.6 80.0 - 100.0 fL     MCH 29.5 24.4 - 34.0 pg    MCHC 33.0 30.0 - 37.0 g/dL    RDW 69.6 (H) 29.5 - 14.0 %    PLATELETS 184 130 - 400 x10^3/uL    MPV 8.4 7.5 - 11.5 fL    NEUTROPHIL % 51 41 - 69 %    LYMPHOCYTE % 34 19 - 46 %    MONOCYTE % 8 4 - 12 %    EOSINOPHIL % 7 (H) 0 - 5 %    BASOPHIL % 1 0 - 2 %    NEUTROPHIL # 3.10 1.80 - 7.50 x10^3/uL    LYMPHOCYTE # 2.10 1.10 - 3.80 x10^3/uL    MONOCYTE # 0.50 0.10 - 0.80 x10^3/uL    EOSINOPHIL # 0.40 0.00 - 0.60 x10^3/uL    BASOPHIL # 0.10 0.00 - 0.20 x10^3/uL       Depression screening is negative. PHQ 2 Total: 0               Assessment & Plan:     ENCOUNTER DIAGNOSES     ICD-10-CM   1. Essential hypertension  I10   2. Type 2 diabetes mellitus with diabetic neuropathy, with long-term current use of insulin (CMS HCC)  E11.40    Z79.4   3. Pre-op testing  Z01.818    Increase toujeo to 40 units at night for DM  EKG as ordered for pre-op for eye surgery  Labs ordered for next appt  Yearly diabetic eye exam  Continue lispro for DM  Continue Crestor for lipids  Continue tramadol and Lyrica for DM neuropathy  counseled on diet and exercise and importance of physical fitness and caloric restriction in minimizing risk for diabetes and other chronic medical conditions.  Dietary Recommendations:  -avoid all sugar in your diet, candy, sugar sweetened beverages, sugary snacks like ice cream or candy bars or other candy  -avoid processed foods when you eat out try to order a healthy meat choice, salad and vegetables avoid eating the bread  -avoid wheat products such as pasta and bread you can substitute with low carb wraps and wild rice or even small amounts of white rice  -focus your plate on healthy protein choices such as meats, tofu, and green veggies  -for snacks consider veggies and small amounts of fruit and popcorn  -drink plenty of water 1/2 oz for every pound of body weight        Orders Placed This Encounter    COMPREHENSIVE METABOLIC PNL, FASTING    HGA1C (HEMOGLOBIN A1C WITH EST AVG  GLUCOSE)    LIPID PANEL    ECG 12 LEAD - [facility]    insulin glargine U-300 conc (TOUJEO SOLOSTAR U-300 INSULIN) 300 unit/mL (1.5 mL) Subcutaneous Insulin Pen  Return in about 3 months (around 05/22/2023) for diabetes.      Colin Benton, DO  02/21/2023, 14:31    EKG reviewed and pt is cleared for eye surgery.    Colin Benton, DO  03/03/23

## 2023-02-22 LAB — URINE HOLD

## 2023-02-23 LAB — URINE CULTURE,ROUTINE: URINE CULTURE: >100000 — AB

## 2023-02-27 ENCOUNTER — Telehealth (INDEPENDENT_AMBULATORY_CARE_PROVIDER_SITE_OTHER): Payer: Self-pay | Admitting: Internal Medicine

## 2023-02-27 ENCOUNTER — Other Ambulatory Visit (INDEPENDENT_AMBULATORY_CARE_PROVIDER_SITE_OTHER): Payer: Self-pay | Admitting: Internal Medicine

## 2023-02-27 MED ORDER — SULFAMETHOXAZOLE 800 MG-TRIMETHOPRIM 160 MG TABLET
1.0000 | ORAL_TABLET | Freq: Two times a day (BID) | ORAL | 0 refills | Status: DC
Start: 2023-02-27 — End: 2023-05-23

## 2023-02-27 NOTE — Telephone Encounter (Signed)
-----   Message from Colin Benton sent at 02/27/2023 12:57 PM EST -----  Please notify the patient she has a UTI,  will treat with bactrim DS 1 tab BID x 3 days, [facility]

## 2023-02-28 ENCOUNTER — Other Ambulatory Visit: Payer: Self-pay

## 2023-02-28 ENCOUNTER — Ambulatory Visit
Admission: RE | Admit: 2023-02-28 | Discharge: 2023-02-28 | Disposition: A | Discharge status: Home / Self Care | Payer: Medicare HMO | Source: Ambulatory Visit | Attending: Internal Medicine | Admitting: Internal Medicine

## 2023-02-28 DIAGNOSIS — Z01818 Encounter for other preprocedural examination: Secondary | ICD-10-CM | POA: Insufficient documentation

## 2023-02-28 DIAGNOSIS — I10 Essential (primary) hypertension: Secondary | ICD-10-CM | POA: Insufficient documentation

## 2023-03-01 LAB — ECG 12 LEAD
Atrial Rate: 73 {beats}/min
Calculated P Axis: 3 degrees
Calculated R Axis: 2 degrees
Calculated T Axis: 54 degrees
PR Interval: 256 ms
QRS Duration: 68 ms
QT Interval: 374 ms
QTC Calculation: 412 ms
Ventricular rate: 73 {beats}/min

## 2023-03-06 ENCOUNTER — Other Ambulatory Visit: Payer: Self-pay

## 2023-03-22 ENCOUNTER — Ambulatory Visit (INDEPENDENT_AMBULATORY_CARE_PROVIDER_SITE_OTHER): Payer: Medicare HMO | Admitting: Foot & Ankle Surgery

## 2023-03-22 ENCOUNTER — Other Ambulatory Visit: Payer: Self-pay

## 2023-03-22 ENCOUNTER — Encounter (INDEPENDENT_AMBULATORY_CARE_PROVIDER_SITE_OTHER): Payer: Self-pay | Admitting: Foot & Ankle Surgery

## 2023-03-22 DIAGNOSIS — L84 Corns and callosities: Secondary | ICD-10-CM

## 2023-03-22 DIAGNOSIS — M2041 Other hammer toe(s) (acquired), right foot: Secondary | ICD-10-CM

## 2023-03-22 DIAGNOSIS — E1149 Type 2 diabetes mellitus with other diabetic neurological complication: Secondary | ICD-10-CM

## 2023-03-22 NOTE — Progress Notes (Signed)
[name], [facility]  [phone] [address]. [place] [address]            Name: Molly Howell MRN:  [id number]   Date: 03/22/2023 Age: 78 y.o.         Chief Complaint:   Chief Complaint   Patient presents with    Foot Pain     Pt is here for a follow up of the R foot.        Subjective  Molly Howell is a 78 y.o. female who presents to clinic for follow-up of right foot preulcerative callus.  She relates no pain no redness no drainage.  Wearing diabetic shoes      Objective    On physical examination Molly Howell is seated comfortably in the examination room in no apparent distress.  Alert and oriented to time and place. Mood and affect are normal and appropriate to situation.  She  appears well developed, well nourished, with good attention to hygiene and body habitus.    Skin:  Area of ulceration along the dorsal lateral right 5th toe with thick callus dry intra lesion bleed.  With the with debridement there is no open lesion no ulceration no active bleeding debridement  Musculoskeletal:  Right 5th toe contracted at the IPJ, rigid  Neurological:  Does not respond to pain stimuli right forefoot  Vascular:  Skin temperature is warm to warm proximal to distal right.  CFT is immediate to all toes        Assessment/ Plan  Molly Howell was seen today for foot pain.    Diagnoses and all orders for this visit:    Hammer toe of right foot    Type II or unspecified type diabetes mellitus with neurological manifestations, uncontrolled(250.62) (CMS HCC)        1. Today I explained the etiology, prognosis, and treatment options for preulcerative callus right 5th toe secondary to diabetic neuropathy and hammertoe deformity.    2. I discussed treatment options including offloading, callus debridement.    3. Today I recommended continue with accommodative shoes.  I debrided callus to level of comfort using a 15. Blade right 5th toe.  No ulcer bleeding debridement.  Advised her to monitor for pain  redness swelling drainage call with questions or concerns.    4. Follow up in 2-3 months sooner if needed.      Wyn Quaker, DPM        This note was partially created using M*Modal fluency direct system (voice recognition software ) and is inherently subject to errors including those of syntax and "sound- alike" substitutions which may escape proofreading.  In such instances, , original meaning may be extrapolated by contextual derivation.

## 2023-03-23 ENCOUNTER — Other Ambulatory Visit (INDEPENDENT_AMBULATORY_CARE_PROVIDER_SITE_OTHER): Payer: Self-pay | Admitting: Internal Medicine

## 2023-03-23 MED ORDER — PREGABALIN 150 MG CAPSULE
150.0000 mg | ORAL_CAPSULE | Freq: Two times a day (BID) | ORAL | 0 refills | Status: DC
Start: 2023-03-23 — End: 2023-06-22

## 2023-04-14 ENCOUNTER — Other Ambulatory Visit: Payer: Self-pay

## 2023-04-14 ENCOUNTER — Ambulatory Visit
Admission: RE | Admit: 2023-04-14 | Discharge: 2023-04-14 | Disposition: A | Discharge status: Home / Self Care | Payer: Self-pay | Source: Ambulatory Visit | Attending: Internal Medicine | Admitting: Internal Medicine

## 2023-04-14 DIAGNOSIS — G5603 Carpal tunnel syndrome, bilateral upper limbs: Secondary | ICD-10-CM | POA: Insufficient documentation

## 2023-04-14 DIAGNOSIS — G5623 Lesion of ulnar nerve, bilateral upper limbs: Secondary | ICD-10-CM

## 2023-04-19 ENCOUNTER — Other Ambulatory Visit (INDEPENDENT_AMBULATORY_CARE_PROVIDER_SITE_OTHER): Payer: Self-pay | Admitting: Internal Medicine

## 2023-04-19 NOTE — Telephone Encounter (Signed)
 Pt left a voicemail requesting a refill for indomethacin and tramadol. Pt said that the indomethacin was prescribed for 3 times a day but she only takes it as needed. Indomethacin is not listed on the current list. Please send if appropriate or let me know if it is ok and I can send you the request.

## 2023-04-20 ENCOUNTER — Other Ambulatory Visit (INDEPENDENT_AMBULATORY_CARE_PROVIDER_SITE_OTHER): Payer: Self-pay | Admitting: Internal Medicine

## 2023-04-20 MED ORDER — TRAMADOL 50 MG TABLET
1.0000 | ORAL_TABLET | Freq: Four times a day (QID) | ORAL | 2 refills | Status: DC | PRN
Start: 2023-04-20 — End: 2023-07-20

## 2023-04-21 ENCOUNTER — Other Ambulatory Visit (INDEPENDENT_AMBULATORY_CARE_PROVIDER_SITE_OTHER): Payer: Self-pay | Admitting: Internal Medicine

## 2023-04-21 ENCOUNTER — Ambulatory Visit (INDEPENDENT_AMBULATORY_CARE_PROVIDER_SITE_OTHER): Payer: Self-pay | Admitting: Internal Medicine

## 2023-04-21 DIAGNOSIS — G5603 Carpal tunnel syndrome, bilateral upper limbs: Secondary | ICD-10-CM

## 2023-04-21 MED ORDER — INDOMETHACIN 25 MG CAPSULE
25.0000 mg | ORAL_CAPSULE | Freq: Three times a day (TID) | ORAL | 0 refills | Status: DC | PRN
Start: 2023-04-21 — End: 2023-08-02

## 2023-05-04 ENCOUNTER — Ambulatory Visit: Payer: Self-pay | Attending: ORTHOPAEDIC SURGERY | Admitting: ORTHOPAEDIC SURGERY

## 2023-05-04 ENCOUNTER — Other Ambulatory Visit: Payer: Self-pay

## 2023-05-04 ENCOUNTER — Encounter (INDEPENDENT_AMBULATORY_CARE_PROVIDER_SITE_OTHER): Payer: Self-pay | Admitting: ORTHOPAEDIC SURGERY

## 2023-05-04 DIAGNOSIS — G5603 Carpal tunnel syndrome, bilateral upper limbs: Secondary | ICD-10-CM | POA: Insufficient documentation

## 2023-05-04 NOTE — Progress Notes (Signed)
 SPORTS MEDICINE, [facility]  [address]   [place] [address]  Operated by [facility]       Name: Floriene Jeschke   DOB: [birth year]  [78 y.o. female]   MRN: [id number]       Visit Date: 05/04/2023   Referring: Mason Sole, DO  [address]  [address]  [place],  [place] [address]   PCP: Mason Sole, DO     Chief Complaint   Patient presents with    Wrist Pain     Severe CTS bilateral     HPI: Molly Howell 78 y.o. female presents to the clinic for BIL carpal tunnel. Patient reports she recently had an EMG done. She is experiencing numbness in both hands but the right is worse than the left.     No Known Allergies    Past medical history, surgical history, allergies, social history, and review of systems reviewed.    Assessment: BIL wrist carpal tunnel syndrome     Plan: Risks and benefits of surgery were discussed in detail with the patient. Patient elected to proceed with BIL carpal tunnel release. Will call to schedule.     I am scribing for, and in the presence of Dr. Ananias Karma, for services provided on 05/04/23.   [address], SCRIBE   [place], [place], 05/04/2023 14:32    I personally performed the services described in this documentation, as scribed  in my presence, and it is both accurate  and complete.    Ananias Karma, MD

## 2023-05-04 NOTE — Progress Notes (Addendum)
 SPORTS MEDICINE, [facility]  [address]  [place] [address]  Operated by [facility]    Patient: Molly Howell  MRN: [id number]  Date of Service: 05/04/23  Date of Birth: Jul 13, 1945  Age: 78 y.o.      CHIEF COMPLAINT  Bilateral hand numbness    HISTORY OF PRESENT ILLNESS  Molly Howell presents to clinic today for initial evaluation of her bilateral hands.  Patient reports that she is a right-hand dominant female who has been having bilateral hand numbness for several years.  Patient reports that her symptoms seem to be worse in the right than in the left.  She reports that initially it began as occasional numbness and tingling in his now constant numbness and tingling.  Patient does report that she has decreased strength as well as she has recurrent issues with dropping items.  She did have an EMG completed and she is here today to discuss those results and whether she is a good candidate for carpal tunnel release.    MEDICATIONS  allopurinoL  (ZYLOPRIM ) 100 mg Oral Tablet, Take 1 Tablet (100 mg total) by mouth Once a day for 360 days  aspirin 325 mg Oral Tablet, Take 1 Tablet (325 mg total) by mouth Once a day  atenoloL  (TENORMIN ) 25 mg Oral Tablet, Take 1 Tablet (25 mg total) by mouth Once a day  cholecalciferol, vitamin D3, 25 mcg (1,000 unit) Oral Tablet, Take 1 Tablet (1,000 Units total) by mouth Once a day  colchicine  0.6 mg Oral Tablet, Take 1 Tablet (0.6 mg total) by mouth Once a day for 180 days  cyanocobalamin, vitamin B-12, 5,000 mcg Sublingual Tablet, Sublingual, Place under the tongue  flash glucose sensor (FREESTYLE LIBRE 2 SENSOR) Does not apply Kit, APPLY EVERY 14 DAYS  indomethacin  (INDOCIN ) 25 mg Oral Capsule, Take 1 Capsule (25 mg total) by mouth Three times a day as needed  insulin  glargine U-300 conc (TOUJEO  SOLOSTAR U-300 INSULIN ) 300 unit/mL (1.5 mL) Subcutaneous Insulin  Pen, Inject 40 Units under the skin Every night  insulin  lispro 100 unit/mL Subcutaneous Solution,  Inject 15 Units under the skin Twice a day before meals for 180 days Inject 5 Units under the skin at [place].  isosorbide  mononitrate (IMDUR ) 30 mg Oral Tablet Sustained Release 24 hr, Take 1 Tablet (30 mg total) by mouth Every morning  lisinopriL  (PRINIVIL ) 20 mg Oral Tablet, Take 1 Tablet (20 mg total) by mouth Once a day  magnesium oxide (MAG-OX) 400 mg Oral Tablet, Take 1 Tablet (400 mg total) by mouth Once a day  multivit-minerals/folic acid (MULTIVITAMIN GUMMIES ORAL), Take 1 Tablet by mouth Once a day  omeprazole  (PRILOSEC) 40 mg Oral Capsule, Delayed Release(E.C.), Take 1 Capsule (40 mg total) by mouth Once a day  pregabalin  (LYRICA ) 150 mg Oral Capsule, Take 1 Capsule (150 mg total) by mouth Twice daily  rosuvastatin  (CRESTOR ) 10 mg Oral Tablet, Take 1 Tablet (10 mg total) by mouth Every evening for 180 days  traMADoL  (ULTRAM ) 50 mg Oral Tablet, Take 1 Tablet (50 mg total) by mouth Every 6 hours as needed for Pain  trimethoprim -sulfamethoxazole  (BACTRIM  DS) 160-800mg  per tablet, Take 1 Tablet (160 mg total) by mouth Twice daily    No facility-administered medications prior to visit.        ALLERGIES  Patient reports no known allergies    PAST MEDICAL HISTORY  Patient reports past medical history is significant for diabetes, heart attack, obesity, acid reflux, gout, hypertension, coronary artery disease, high  cholesterol, osteoporosis, hernia    PAST SURGICAL HISTORY  Patient reports past surgical history is significant for hysterectomy cholecystectomy colonoscopy coronary artery stent placement appendectomy hernia repair cataract procedure denies issues with anesthesia    SOCIAL HISTORY  Patient reports he is currently retired she denies any smoking chewing tobacco or alcohol use at this time    FAMILY HISTORY  Patient reports family history is significant for diabetes heart attack obesity acid reflux gout hypertension coronary artery disease and high cholesterol    REVIEW OF SYSTEMS  Constitutional: denies   fevers or night sweats  Skin: denies lesions and rashes   Cardiovascular: denies chest pain, history of arrhythmia, history of MI, history of a murmur  Respiratory: denies shortness of breath, cough  Gastrointestinal: denies abdominal pain, nausea, vomiting, or diarrhea   Genitourinary: denies incontinence, dysuria,  Neurological: denies tics, tremors, seizures   Psychiatric: denies history of depression, thoughts of harm to self or others   Vascular: denies history of a DVT, claudication,   Musculoskeletal: SEE HPI     All other systems reviewed and negative    PHYSICAL EXAMINATION   Ht 1.6 m (5\' 3" )   Wt 70.3 kg (155 lb)   BMI 27.46 kg/m       GEN:   On physical exam this is a 78 year old female with appropriate mood and affect in no apparent distress  SKIN: Warm and Dry without lesions or rashes   NECK: Neck symmetrical and trachea midline.  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  She has decreased sensation bilaterally across the median nerve distribution.  She has decreased strength with finger flexion.  She also has a positive Phalen's test bilaterally.  NEURO: Sensation intact distally   PSYCHOSOCIAL:   Alert and Oriented to person, place and time with appropriate mood and affect.     Skin: Warm and Dry         EMG of the patient's bilateral hands were reviewed in clinic today and do show severe carpal tunnel syndrome bilaterally right greater than left    ASSESSMENT     ICD-10-CM    1. Bilateral carpal tunnel syndrome  G56.03             PLAN  At this time we explained to the patient that we could move forward with a bilateral carpal tunnel release or carpal tunnel  release unilaterally.  Our recommendation would be to move forward with a release of her carpal tunnel as she is having significant numbness and tingling and she does show some mild muscle atrophy.  Patient was understanding of this she did wish to move forward in this fashion all her questions and concerns were answered at today's visit if she has any further questions or concerns he can feel free to call us  otherwise we will see her back for the day of procedure    A shared visit was performed with the co-signing physician.      Kristopher Catalano, PA-C 05/04/2023, 13:48     I saw and examined the patient as part of a shared service with an APP.  I reviewed the midlevel's  note.  I agree with the findings as documented in the midlevel's note.  Any exceptions/additions are edited/noted below or in my brief additional note for this encounter.    Xzavier Swinger A. Lord Rod., M.D.

## 2023-05-04 NOTE — Nursing Note (Signed)
 NPV cc of Bilateral CTS- patient states she recently had an EMG done.  Having numbness in both hands but right is greater than left.

## 2023-05-08 ENCOUNTER — Encounter (INDEPENDENT_AMBULATORY_CARE_PROVIDER_SITE_OTHER): Payer: Self-pay | Admitting: ORTHOPAEDIC SURGERY

## 2023-05-09 ENCOUNTER — Other Ambulatory Visit: Payer: Self-pay

## 2023-05-16 ENCOUNTER — Telehealth (INDEPENDENT_AMBULATORY_CARE_PROVIDER_SITE_OTHER): Payer: Self-pay | Admitting: ORTHOPAEDIC SURGERY

## 2023-05-18 ENCOUNTER — Other Ambulatory Visit (INDEPENDENT_AMBULATORY_CARE_PROVIDER_SITE_OTHER): Payer: Self-pay | Admitting: Internal Medicine

## 2023-05-18 MED ORDER — ROSUVASTATIN 10 MG TABLET
10.0000 mg | ORAL_TABLET | Freq: Every evening | ORAL | 1 refills | Status: DC
Start: 2023-05-18 — End: 2023-05-18

## 2023-05-18 NOTE — Telephone Encounter (Signed)
 Next app 05/23/23

## 2023-05-20 ENCOUNTER — Other Ambulatory Visit: Payer: Self-pay

## 2023-05-20 ENCOUNTER — Ambulatory Visit: Attending: Internal Medicine

## 2023-05-20 DIAGNOSIS — E1140 Type 2 diabetes mellitus with diabetic neuropathy, unspecified: Secondary | ICD-10-CM | POA: Insufficient documentation

## 2023-05-20 DIAGNOSIS — I10 Essential (primary) hypertension: Secondary | ICD-10-CM | POA: Insufficient documentation

## 2023-05-20 DIAGNOSIS — Z794 Long term (current) use of insulin: Secondary | ICD-10-CM | POA: Insufficient documentation

## 2023-05-20 LAB — COMPREHENSIVE METABOLIC PNL, FASTING
ALBUMIN/GLOBULIN RATIO: 1.4 — ABNORMAL LOW (ref 1.5–2.5)
ALBUMIN: 3.9 g/dL (ref 3.5–5.0)
ALKALINE PHOSPHATASE: 155 U/L — ABNORMAL HIGH (ref 38–126)
ALT (SGPT): 12 U/L (ref <35)
ANION GAP: 7 mmol/L (ref 5–19)
AST (SGOT): 28 U/L (ref 14–36)
BILIRUBIN TOTAL: 0.4 mg/dL (ref 0.2–1.3)
BUN/CREA RATIO: 19 (ref 6–20)
BUN: 18 mg/dL — ABNORMAL HIGH (ref 7–17)
CALCIUM: 9.9 mg/dL (ref 8.4–10.2)
CHLORIDE: 110 mmol/L — ABNORMAL HIGH (ref 98–107)
CO2 TOTAL: 22 mmol/L (ref 22–30)
CREATININE: 0.97 mg/dL (ref 0.52–1.00)
ESTIMATED GFR: 60 mL/min/{1.73_m2} — ABNORMAL LOW (ref >60)
GLUCOSE: 182 mg/dL — ABNORMAL HIGH (ref 74–106)
POTASSIUM: 5 mmol/L (ref 3.5–5.1)
PROTEIN TOTAL: 6.6 g/dL (ref 6.3–8.2)
SODIUM: 139 mmol/L (ref 137–145)

## 2023-05-20 LAB — LIPID PANEL
CHOLESTEROL: 88 mg/dL (ref 0–200)
HDL CHOL: 32 mg/dL — ABNORMAL LOW (ref >40)
LDL CALC: 25 mg/dL (ref <100)
TRIGLYCERIDES: 156 mg/dL — ABNORMAL HIGH (ref <150)

## 2023-05-21 LAB — HGA1C (HEMOGLOBIN A1C WITH EST AVG GLUCOSE)
ESTIMATED AVERAGE GLUCOSE: 189 mg/dL
HEMOGLOBIN A1C: 8.2 % — ABNORMAL HIGH (ref 4.0–5.6)

## 2023-05-23 ENCOUNTER — Encounter (INDEPENDENT_AMBULATORY_CARE_PROVIDER_SITE_OTHER): Payer: Self-pay | Admitting: Internal Medicine

## 2023-05-23 ENCOUNTER — Other Ambulatory Visit: Payer: Self-pay

## 2023-05-23 ENCOUNTER — Ambulatory Visit: Payer: Self-pay | Attending: Internal Medicine | Admitting: Internal Medicine

## 2023-05-23 VITALS — BP 138/72 | HR 79 | Resp 20 | Ht 63.0 in | Wt 152.0 lb

## 2023-05-23 DIAGNOSIS — I10 Essential (primary) hypertension: Secondary | ICD-10-CM | POA: Insufficient documentation

## 2023-05-23 DIAGNOSIS — I2510 Atherosclerotic heart disease of native coronary artery without angina pectoris: Secondary | ICD-10-CM | POA: Insufficient documentation

## 2023-05-23 DIAGNOSIS — E1140 Type 2 diabetes mellitus with diabetic neuropathy, unspecified: Secondary | ICD-10-CM | POA: Insufficient documentation

## 2023-05-23 DIAGNOSIS — Z794 Long term (current) use of insulin: Secondary | ICD-10-CM | POA: Insufficient documentation

## 2023-05-23 DIAGNOSIS — Z87891 Personal history of nicotine dependence: Secondary | ICD-10-CM

## 2023-05-23 NOTE — Progress Notes (Signed)
 FAMILY MEDICINE, [facility]  [address]  [place] [address]  Phone: [phone]  Fax: [phone]    Encounter Date: 05/23/2023    Patient ID:  Molly Howell  ION:[id number]    DOB: 20-Feb-1945  Age: 78 y.o. female    There are no exam notes on file for this visit.  Subjective:     Chief Complaint   Patient presents with    Follow Up 3 Months     Review lab work. Occasional low blood sugar, pt states 54 @ 1300     Pt here for f/u.  She is watching her diet but not always.     Diabetes-she is getting some low BS, down to 50's.  She will take a glucose tablet and it helps. Usually happens when she first gets up   Controlled-no   Checking BS at home-yes   Eye exam-up to date        Current Outpatient Medications   Medication Sig    allopurinoL (ZYLOPRIM) 100 mg Oral Tablet Take 1 Tablet (100 mg total) by mouth Once a day for 360 days    aspirin 325 mg Oral Tablet Take 1 Tablet (325 mg total) by mouth Daily    atenoloL (TENORMIN) 25 mg Oral Tablet Take 1 Tablet (25 mg total) by mouth Once a day    cholecalciferol, vitamin D3, 25 mcg (1,000 unit) Oral Tablet Take 1 Tablet (1,000 Units total) by mouth Daily    colchicine 0.6 mg Oral Tablet Take 1 Tablet (0.6 mg total) by mouth Once a day for 180 days    cyanocobalamin, vitamin B-12, 5,000 mcg Sublingual Tablet, Sublingual Place under the tongue    flash glucose sensor (FREESTYLE LIBRE 2 SENSOR) Does not apply Kit APPLY EVERY 14 DAYS    indomethacin (INDOCIN) 25 mg Oral Capsule Take 1 Capsule (25 mg total) by mouth Three times a day as needed    insulin glargine U-300 conc (TOUJEO SOLOSTAR U-300 INSULIN) 300 unit/mL (1.5 mL) Subcutaneous Insulin Pen Inject 40 Units under the skin Every night    insulin lispro 100 unit/mL Subcutaneous Solution Inject 15 Units under the skin Twice a day before meals for 180 days Inject 5 Units under the skin at Supper.    isosorbide mononitrate (IMDUR) 30 mg Oral Tablet Sustained Release 24 hr Take 1 Tablet (30 mg  total) by mouth Every morning    lisinopriL (PRINIVIL) 20 mg Oral Tablet Take 1 Tablet (20 mg total) by mouth Once a day    magnesium oxide (MAG-OX) 400 mg Oral Tablet Take 1 Tablet (400 mg total) by mouth Daily    multivit-minerals/folic acid (MULTIVITAMIN GUMMIES ORAL) Take 1 Tablet by mouth Once a day    omeprazole (PRILOSEC) 40 mg Oral Capsule, Delayed Release(E.C.) Take 1 Capsule (40 mg total) by mouth Once a day    pregabalin (LYRICA) 150 mg Oral Capsule Take 1 Capsule (150 mg total) by mouth Twice daily    rosuvastatin (CRESTOR) 10 mg Oral Tablet Take 1 Tablet (10 mg total) by mouth Every evening for 180 days    traMADoL (ULTRAM) 50 mg Oral Tablet Take 1 Tablet (50 mg total) by mouth Every 6 hours as needed for Pain     No Known Allergies  Past Medical History:   Diagnosis Date    Diabetes mellitus, type 2     Gout, unspecified     Hypertension          Social History  Tobacco Use    Smoking status: Former     Current packs/day: 1.00     Types: Cigarettes    Smokeless tobacco: Never   Vaping Use    Vaping status: Never Used   Substance Use Topics    Alcohol use: Never    Drug use: Never         Review of Systems   Constitutional: Negative.    Respiratory: Negative.     Cardiovascular: Negative.    Musculoskeletal:         Amputation right great toe   Neurological:  Positive for numbness.     Objective:    Vitals: BP 138/72   Pulse 79   Resp 20   Ht 1.6 m (5\' 3" )   Wt 68.9 kg (152 lb)   SpO2 98%   BMI 26.93 kg/m           Physical Exam  Vitals and nursing note reviewed.   Eyes:      Comments: glasses   Cardiovascular:      Rate and Rhythm: Normal rate and regular rhythm.      Pulses: Normal pulses.      Heart sounds: Normal heart sounds.   Pulmonary:      Effort: Pulmonary effort is normal.      Breath sounds: Normal breath sounds.   Musculoskeletal:      Comments: Amputation right great toe   Neurological:      Mental Status: She is alert.         Results for orders placed or performed in visit on  05/20/23 (from the past 24 weeks)   COMPREHENSIVE METABOLIC PNL, FASTING   Result Value Ref Range    SODIUM 139 137 - 145 mmol/L    POTASSIUM 5.0 3.5 - 5.1 mmol/L    CHLORIDE 110 (H) 98 - 107 mmol/L    CO2 TOTAL 22 22 - 30 mmol/L    ANION GAP 7 5 - 19 mmol/L    BUN 18 (H) 7 - 17 mg/dL    CREATININE 9.60 4.54 - 1.00 mg/dL    BUN/CREA RATIO 19 6 - 20    ESTIMATED GFR 60 (L) >60 mL/min/1.60m^2    ALBUMIN 3.9 3.5 - 5.0 g/dL    CALCIUM 9.9 8.4 - 09.8 mg/dL    GLUCOSE 119 (H) 74 - 106 mg/dL    ALKALINE PHOSPHATASE 155 (H) 38 - 126 U/L    ALT (SGPT) 12 <35 U/L    AST (SGOT) 28 14 - 36 U/L    BILIRUBIN TOTAL 0.4 0.2 - 1.3 mg/dL    PROTEIN TOTAL 6.6 6.3 - 8.2 g/dL    ALBUMIN/GLOBULIN RATIO 1.4 (L) 1.5 - 2.5    Narrative    Estimated Glomerular Filtration Rate (eGFR) is calculated using the CKD-EPI (2021) equation, intended for patients 61 years of age and older. If gender is not documented or "unknown", there will be no eGFR calculation.   HGA1C (HEMOGLOBIN A1C WITH EST AVG GLUCOSE)   Result Value Ref Range    HEMOGLOBIN A1C 8.2 (H) 4.0 - 5.6 %    ESTIMATED AVERAGE GLUCOSE 189 mg/dL    Narrative    American Diabetes Association (ADA) endorsed Hemoglobin A1C (glycated Hemoglobin) decision points:  Normal: <5.7%, Prediabetic: 5.7-6.4%, Diabetic: >6.4%    An evidence-based therapeutic goal of <7.0% is suggested by the ADA to reduce microvascular and neuropathic complications of type 1 and type 2 diabetes, but clinical goals may vary in different clinical  situations.   There are no age/gender specific ranges for eAG.   LIPID PANEL   Result Value Ref Range    CHOLESTEROL 88 0 - 200 mg/dL    TRIGLYCERIDES 086 (H) <150 mg/dL    HDL CHOL 32 (L) >57 mg/dL    LDL CALC 25 <846 mg/dL   Results for orders placed or performed in visit on 02/18/23 (from the past 24 weeks)   URINE CULTURE,ROUTINE    Specimen: Urine, Clean Catch   Result Value Ref Range    URINE CULTURE >100,000 Klebsiella pneumoniae (A)        Susceptibility    Klebsiella  pneumoniae -  (no method available)     Ampicillin >16 Resistant      Ampicillin/Sulbactam <=8/4 Sensitive      Cefazolin <=2 Sensitive      Cefepime <=2 Sensitive      Ceftriaxone <=1 Sensitive      Cefuroxime <=4 Sensitive      Ciprofloxacin <=0.25 Sensitive      Gentamicin <=2 Sensitive      Levofloxacin <=0.5 Sensitive      Meropenem <=1 Sensitive      Nitrofurantoin <=32 Sensitive      Piperacillin/Tazobactam <=8 Sensitive      Tobramycin <=2 Sensitive      Trimethoprim/Sulfamethoxazole <=2/38 Sensitive    COMPREHENSIVE METABOLIC PNL, FASTING   Result Value Ref Range    SODIUM 138 137 - 145 mmol/L    POTASSIUM 4.9 3.5 - 5.1 mmol/L    CHLORIDE 102 98 - 107 mmol/L    CO2 TOTAL 26 22 - 30 mmol/L    ANION GAP 10 5 - 19 mmol/L    BUN 24 (H) 7 - 17 mg/dL    CREATININE 9.62 (H) 0.52 - 1.00 mg/dL    BUN/CREA RATIO 21 (H) 6 - 20    ESTIMATED GFR 50 (L) >60 mL/min/1.49m^2    ALBUMIN 3.9 3.5 - 5.0 g/dL    CALCIUM 9.1 8.4 - 95.2 mg/dL    GLUCOSE 841 (H) 74 - 106 mg/dL    ALKALINE PHOSPHATASE 169 (H) 38 - 126 U/L    ALT (SGPT) 17 <35 U/L    AST (SGOT) 30 14 - 36 U/L    BILIRUBIN TOTAL 0.5 0.2 - 1.3 mg/dL    PROTEIN TOTAL 6.7 6.3 - 8.2 g/dL    ALBUMIN/GLOBULIN RATIO 1.4 (L) 1.5 - 2.5    Narrative    Estimated Glomerular Filtration Rate (eGFR) is calculated using the CKD-EPI (2021) equation, intended for patients 74 years of age and older. If gender is not documented or "unknown", there will be no eGFR calculation.   HGA1C (HEMOGLOBIN A1C WITH EST AVG GLUCOSE)   Result Value Ref Range    HEMOGLOBIN A1C 8.1 (H) 4.0 - 6.0 %    ESTIMATED AVERAGE GLUCOSE 186 mg/dL   LIPID PANEL   Result Value Ref Range    CHOLESTEROL 84 0 - 200 mg/dL    TRIGLYCERIDES 324 <401 mg/dL    HDL CHOL 34 (L) >02 mg/dL    LDL CALC 28 <725 mg/dL   URINALYSIS WITH REFLEX MICROSCOPIC AND CULTURE IF POSITIVE    Specimen: Urine, Clean Catch    Narrative    The following orders were created for panel order URINALYSIS WITH REFLEX MICROSCOPIC AND CULTURE IF  POSITIVE.  Procedure  Abnormality         Status                     ---------                               -----------         ------                     URINALYSIS, MACRO/MICRO[[id number]]      Abnormal            Final result                 Please view results for these tests on the individual orders.   URINALYSIS, MACRO/MICRO   Result Value Ref Range    COLOR Yellow     APPEARANCE Turbid (A) Clear, Hazy, Cloudy, Slightly Cloudy    SPECIFIC GRAVITY 1.014 1.003 - 1.035    PH 7.0 5.0 - 9.0    LEUKOCYTES Moderate (A) Negative WBCs/uL    NITRITE Negative Negative    PROTEIN 70 (A) Negative, 10 , 20  mg/dL    GLUCOSE Negative 30 , Negative mg/dL    KETONES Negative Negative mg/dL    UROBILINOGEN < 2.0 < 2.0, 1.0, 0.2  mg/dL    BILIRUBIN Negative Negative mg/dL    BLOOD Negative Negative, Trace mg/dL    RBCS 0-2 0-2, None /hpf    WBCS 21-50 (A) 0 - 4 /hpf    BACTERIA Mod (A) None, Few /hpf    SQUAMOUS EPITHELIAL Few None, Few /hpf    MUCOUS Rare None, Rare, Occasional, Few, Mod /hpf    AMORPHOUS SEDIMENT Few None, Few, Mod /hpf   URINE HOLD   Result Value Ref Range    RAINBOW/EXTRA TUBE AUTO RESULT Yes    MICRO HOLD   Result Value Ref Range    RAINBOW/EXTRA TUBE AUTO RESULT Yes    MICRO HOLD   Result Value Ref Range    RAINBOW/EXTRA TUBE AUTO RESULT Yes    Results for orders placed or performed in visit on 11/05/22 (from the past 24 weeks)   MICROALBUMIN/CREATININE RATIO, URINE, RANDOM   Result Value Ref Range    CREATININE RANDOM URINE 127 No Reference Range Established mg/dL    MICROALBUMIN RANDOM URINE 128.9 (H) 0.0 - 1.7 mg/dL    MICROALBUMIN/CREATININE RATIO RANDOM URINE 1,015.0 (H) 0.0 - 29.9 mg/g       Depression screening is negative. PHQ 2 Total: 0               Assessment & Plan:     ENCOUNTER DIAGNOSES     ICD-10-CM   1. Essential hypertension  I10   2. Type 2 diabetes mellitus with diabetic neuropathy, with long-term current use of insulin (CMS HCC)  E11.40    Z79.4   3.  Coronary artery disease involving native coronary artery of native heart without angina pectoris  I25.10      Labs ordered for next appt  Yearly diabetic eye exam  Recommend eating a healthy evening snack  Continue toujeo and lispro for DM  Continue lisinopril for HTN and DM renal protection  Continue Crestor for CAD  Continue tramadol and Lyrica for neuropathy  Check BS up to TID and prn  Continue atenolol for HTN and CAD  Follow up with specialists as scheduled.   counseled on diet  and exercise and importance of physical fitness and caloric restriction in minimizing risk for diabetes and other chronic medical conditions.  Dietary Recommendations:  -avoid all sugar in your diet, candy, sugar sweetened beverages, sugary snacks like ice cream or candy bars or other candy  -avoid processed foods when you eat out try to order a healthy meat choice, salad and vegetables avoid eating the bread  -avoid wheat products such as pasta and bread you can substitute with low carb wraps and wild rice or even small amounts of white rice  -focus your plate on healthy protein choices such as meats, tofu, and green veggies  -for snacks consider veggies and small amounts of fruit and popcorn  -drink plenty of water 1/2 oz for every pound of body weight        Orders Placed This Encounter    COMPREHENSIVE METABOLIC PNL, FASTING    HGA1C (HEMOGLOBIN A1C WITH EST AVG GLUCOSE)    LIPID PANEL                    Return in about 3 months (around 08/22/2023) for diabetes.      Mason Sole, DO  05/23/2023, 14:11

## 2023-05-31 ENCOUNTER — Other Ambulatory Visit: Payer: Self-pay

## 2023-05-31 ENCOUNTER — Ambulatory Visit
Admission: RE | Admit: 2023-05-31 | Discharge: 2023-05-31 | Disposition: A | Discharge status: Home / Self Care | Payer: Self-pay | Source: Ambulatory Visit | Attending: ORTHOPAEDIC SURGERY | Admitting: ORTHOPAEDIC SURGERY

## 2023-05-31 ENCOUNTER — Ambulatory Visit (HOSPITAL_COMMUNITY): Payer: Self-pay

## 2023-05-31 ENCOUNTER — Encounter (HOSPITAL_COMMUNITY): Payer: Self-pay

## 2023-05-31 HISTORY — DX: Rash and other nonspecific skin eruption: R21

## 2023-05-31 HISTORY — DX: Acute myocardial infarction, unspecified: I21.9

## 2023-05-31 HISTORY — DX: Presence of spectacles and contact lenses: Z97.3

## 2023-05-31 HISTORY — DX: Urinary tract infection, site not specified: N39.0

## 2023-05-31 HISTORY — DX: Type 2 diabetes mellitus without complications: E11.9

## 2023-05-31 HISTORY — DX: Other general symptoms and signs: R68.89

## 2023-05-31 NOTE — Anesthesia Preprocedure Evaluation (Addendum)
 ANESTHESIA PRE-OP EVALUATION  Planned Procedure: RELEASE CARPAL TUNNEL ENDOSCOPIC (Bilateral: Wrist)  Review of Systems     anesthesia history negative     patient summary reviewed  nursing notes reviewed        Pulmonary     Cardiovascular    Hypertension, past MI, CAD and cardiac stents ,No peripheral edema,        GI/Hepatic/Renal           Endo/Other      type 2 diabetes/ controlled with insulin     Neuro/Psych/MS    Neck problems     Cancer                      Physical Assessment      Airway       Mallampati: II    TM distance: >3 FB    Neck ROM: full  Mouth Opening: good.            Dental                    Pulmonary           Cardiovascular    Rhythm: regular  Rate: Normal  (-) no friction rub, carotid bruit is not present, no peripheral edema and no murmur     Other findings          Plan  ASA 3     Planned anesthesia type: MAC                         Intravenous induction     Anesthesia issues/risks discussed are: Cardiac Events/MI and Aspiration.  Anesthetic plan and risks discussed with patient  signed consent obtained          Patient's NPO status is appropriate for Anesthesia.

## 2023-06-06 ENCOUNTER — Telehealth (INDEPENDENT_AMBULATORY_CARE_PROVIDER_SITE_OTHER): Payer: Self-pay | Admitting: Internal Medicine

## 2023-06-06 NOTE — Telephone Encounter (Signed)
 I spoke with pt regarding the diabetic clinic, pt states unable to attend.

## 2023-06-09 ENCOUNTER — Ambulatory Visit (HOSPITAL_COMMUNITY): Admitting: Certified Registered"

## 2023-06-09 ENCOUNTER — Other Ambulatory Visit (INDEPENDENT_AMBULATORY_CARE_PROVIDER_SITE_OTHER): Payer: Self-pay | Admitting: PHYSICIAN ASSISTANT

## 2023-06-09 ENCOUNTER — Encounter (HOSPITAL_COMMUNITY): Payer: Self-pay | Admitting: ORTHOPAEDIC SURGERY

## 2023-06-09 ENCOUNTER — Encounter (HOSPITAL_COMMUNITY)
Admission: RE | Disposition: A | Discharge status: Home / Self Care | Payer: Self-pay | Source: Ambulatory Visit | Attending: ORTHOPAEDIC SURGERY

## 2023-06-09 ENCOUNTER — Ambulatory Visit (HOSPITAL_COMMUNITY): Admitting: ORTHOPAEDIC SURGERY

## 2023-06-09 ENCOUNTER — Ambulatory Visit (HOSPITAL_COMMUNITY): Payer: Self-pay | Admitting: Anesthesiology

## 2023-06-09 ENCOUNTER — Ambulatory Visit
Admission: RE | Admit: 2023-06-09 | Discharge: 2023-06-09 | Disposition: A | Discharge status: Home / Self Care | Payer: Self-pay | Source: Ambulatory Visit | Attending: ORTHOPAEDIC SURGERY | Admitting: ORTHOPAEDIC SURGERY

## 2023-06-09 ENCOUNTER — Other Ambulatory Visit: Payer: Self-pay

## 2023-06-09 DIAGNOSIS — G5603 Carpal tunnel syndrome, bilateral upper limbs: Secondary | ICD-10-CM | POA: Insufficient documentation

## 2023-06-09 DIAGNOSIS — G5623 Lesion of ulnar nerve, bilateral upper limbs: Secondary | ICD-10-CM | POA: Insufficient documentation

## 2023-06-09 DIAGNOSIS — Z794 Long term (current) use of insulin: Secondary | ICD-10-CM | POA: Insufficient documentation

## 2023-06-09 DIAGNOSIS — I2510 Atherosclerotic heart disease of native coronary artery without angina pectoris: Secondary | ICD-10-CM | POA: Insufficient documentation

## 2023-06-09 DIAGNOSIS — I252 Old myocardial infarction: Secondary | ICD-10-CM | POA: Insufficient documentation

## 2023-06-09 DIAGNOSIS — E119 Type 2 diabetes mellitus without complications: Secondary | ICD-10-CM | POA: Insufficient documentation

## 2023-06-09 DIAGNOSIS — Z9889 Other specified postprocedural states: Secondary | ICD-10-CM

## 2023-06-09 DIAGNOSIS — S61224A Laceration with foreign body of right ring finger without damage to nail, initial encounter: Secondary | ICD-10-CM | POA: Insufficient documentation

## 2023-06-09 DIAGNOSIS — I10 Essential (primary) hypertension: Secondary | ICD-10-CM | POA: Insufficient documentation

## 2023-06-09 LAB — POC BLOOD GLUCOSE (RESULTS)
GLUCOSE, POC: 116 mg/dL — ABNORMAL HIGH (ref 74–106)
GLUCOSE, POC: 94 mg/dL (ref 74–106)
GLUCOSE, POC: 122 mg/dL — ABNORMAL HIGH (ref 74–106)
GLUCOSE, POC: 69 mg/dL — ABNORMAL LOW (ref 74–106)
GLUCOSE, POC: 71 mg/dL — ABNORMAL LOW (ref 74–106)

## 2023-06-09 SURGERY — RELEASE CARPAL TUNNEL ENDOSCOPIC
Anesthesia: General | Site: Wrist | Laterality: Bilateral | Wound class: Clean Wound: Uninfected operative wounds in which no inflammation occurred

## 2023-06-09 MED ORDER — SODIUM CHLORIDE 0.9% FLUSH BAG - 250 ML
INTRAVENOUS | Status: DC | PRN
Start: 2023-06-09 — End: 2023-06-09

## 2023-06-09 MED ORDER — SODIUM CHLORIDE 0.9 % (FLUSH) INJECTION SYRINGE
10.0000 mL | INJECTION | Freq: Three times a day (TID) | INTRAMUSCULAR | Status: DC
Start: 2023-06-09 — End: 2023-06-09

## 2023-06-09 MED ORDER — HYDROMORPHONE (PF) 0.5 MG/0.5 ML INJECTION SYRINGE
0.5000 mg | INJECTION | INTRAMUSCULAR | Status: DC | PRN
Start: 2023-06-09 — End: 2023-06-09

## 2023-06-09 MED ORDER — LACTATED RINGERS INTRAVENOUS SOLUTION
INTRAVENOUS | Status: DC
Start: 2023-06-09 — End: 2023-06-09

## 2023-06-09 MED ORDER — DROPERIDOL 2.5 MG/ML INJECTION SOLUTION
0.6250 mg | Freq: Once | INTRAMUSCULAR | Status: DC | PRN
Start: 2023-06-09 — End: 2023-06-09

## 2023-06-09 MED ORDER — LACTATED RINGERS INTRAVENOUS SOLUTION
INTRAVENOUS | Status: DC | PRN
Start: 2023-06-09 — End: 2023-06-09

## 2023-06-09 MED ORDER — PROPOFOL 10 MG/ML INTRAVENOUS EMULSION
INTRAVENOUS | Status: DC | PRN
Start: 2023-06-09 — End: 2023-06-09
  Administered 2023-06-09: 250 ug/kg/min via INTRAVENOUS
  Administered 2023-06-09: 75 ug/kg/min via INTRAVENOUS
  Administered 2023-06-09: 50 ug/kg/min via INTRAVENOUS
  Administered 2023-06-09: 0 ug/kg/min via INTRAVENOUS
  Administered 2023-06-09 (×2): 75 ug/kg/min via INTRAVENOUS
  Administered 2023-06-09: 100 ug/kg/min via INTRAVENOUS

## 2023-06-09 MED ORDER — HYDROMORPHONE (PF) 0.5 MG/0.5 ML INJECTION SYRINGE
0.2000 mg | INJECTION | INTRAMUSCULAR | Status: DC | PRN
Start: 2023-06-09 — End: 2023-06-09

## 2023-06-09 MED ORDER — IPRATROPIUM 0.5 MG-ALBUTEROL 3 MG (2.5 MG BASE)/3 ML NEBULIZATION SOLN
3.0000 mL | INHALATION_SOLUTION | Freq: Once | RESPIRATORY_TRACT | Status: DC | PRN
Start: 2023-06-09 — End: 2023-06-09

## 2023-06-09 MED ORDER — SODIUM CHLORIDE 0.9 % (FLUSH) INJECTION SYRINGE
10.0000 mL | INJECTION | INTRAMUSCULAR | Status: DC | PRN
Start: 2023-06-09 — End: 2023-06-09

## 2023-06-09 MED ORDER — BUPIVACAINE (PF) 0.5 % (5 MG/ML) INJECTION SOLUTION
Freq: Once | INTRAMUSCULAR | Status: DC | PRN
Start: 2023-06-09 — End: 2023-06-09
  Administered 2023-06-09: 43 mL

## 2023-06-09 MED ORDER — DOCUSATE SODIUM 100 MG CAPSULE
100.0000 mg | ORAL_CAPSULE | Freq: Two times a day (BID) | ORAL | 0 refills | Status: AC
Start: 2023-06-09 — End: ?

## 2023-06-09 MED ORDER — ONDANSETRON HCL 4 MG TABLET
4.0000 mg | ORAL_TABLET | Freq: Three times a day (TID) | ORAL | 0 refills | Status: DC | PRN
Start: 2023-06-09 — End: 2023-06-09

## 2023-06-09 MED ORDER — MIDAZOLAM 1 MG/ML INJECTION WRAPPER
INTRAMUSCULAR | Status: AC
Start: 2023-06-09 — End: 2023-06-09
  Filled 2023-06-09: qty 2

## 2023-06-09 MED ORDER — DEXTROSE 5% IN WATER (D5W) FLUSH BAG - 250 ML
INTRAVENOUS | Status: DC | PRN
Start: 2023-06-09 — End: 2023-06-09

## 2023-06-09 MED ORDER — ACETAMINOPHEN 1,000 MG/100 ML (10 MG/ML) INTRAVENOUS SOLUTION
Freq: Once | INTRAVENOUS | Status: DC | PRN
Start: 2023-06-09 — End: 2023-06-09
  Administered 2023-06-09: 1000 mg via INTRAVENOUS

## 2023-06-09 MED ORDER — OXYCODONE-ACETAMINOPHEN 5 MG-325 MG TABLET
1.0000 | ORAL_TABLET | Freq: Once | ORAL | Status: DC | PRN
Start: 2023-06-09 — End: 2023-06-09

## 2023-06-09 MED ORDER — CEFAZOLIN 2 GRAM/100 ML IN DEXTROSE(ISO-OSMOTIC) INTRAVENOUS PIGGYBACK
2.0000 g | INJECTION | Freq: Once | INTRAVENOUS | Status: AC
Start: 2023-06-09 — End: 2023-06-09
  Administered 2023-06-09: 2 g via INTRAVENOUS
  Filled 2023-06-09: qty 100

## 2023-06-09 MED ORDER — MIDAZOLAM 1 MG/ML INJECTION WRAPPER
Freq: Once | INTRAMUSCULAR | Status: DC | PRN
Start: 2023-06-09 — End: 2023-06-09
  Administered 2023-06-09: 1 mg via INTRAVENOUS

## 2023-06-09 MED ORDER — ALBUTEROL SULFATE 2.5 MG/3 ML (0.083 %) SOLUTION FOR NEBULIZATION
2.5000 mg | INHALATION_SOLUTION | Freq: Once | RESPIRATORY_TRACT | Status: DC | PRN
Start: 2023-06-09 — End: 2023-06-09

## 2023-06-09 MED ORDER — BUPIVACAINE (PF) 0.5 % (5 MG/ML) INJECTION SOLUTION
INTRAMUSCULAR | Status: AC
Start: 2023-06-09 — End: 2023-06-09
  Filled 2023-06-09: qty 30

## 2023-06-09 MED ORDER — ONDANSETRON HCL (PF) 4 MG/2 ML INJECTION SOLUTION
4.0000 mg | Freq: Once | INTRAMUSCULAR | Status: DC | PRN
Start: 2023-06-09 — End: 2023-06-09

## 2023-06-09 MED ORDER — FENTANYL (PF) 50 MCG/ML INJECTION SOLUTION
Freq: Once | INTRAMUSCULAR | Status: DC | PRN
Start: 2023-06-09 — End: 2023-06-09
  Administered 2023-06-09 (×2): 25 ug via INTRAVENOUS

## 2023-06-09 MED ORDER — FENTANYL (PF) 50 MCG/ML INJECTION SOLUTION
INTRAMUSCULAR | Status: AC
Start: 2023-06-09 — End: 2023-06-09
  Filled 2023-06-09: qty 2

## 2023-06-09 MED ORDER — HYDROCODONE 5 MG-ACETAMINOPHEN 325 MG TABLET
1.0000 | ORAL_TABLET | ORAL | 0 refills | Status: DC | PRN
Start: 2023-06-09 — End: 2023-06-09

## 2023-06-09 MED ORDER — DEXMEDETOMIDINE 4 MCG/ML IV DILUTION
Freq: Once | INTRAMUSCULAR | Status: DC | PRN
Start: 2023-06-09 — End: 2023-06-09
  Administered 2023-06-09: 12 ug via INTRAVENOUS
  Administered 2023-06-09: 8 ug via INTRAVENOUS

## 2023-06-09 MED ORDER — EPHEDRINE SULFATE 50 MG/ML INTRAVENOUS SOLUTION
Freq: Once | INTRAVENOUS | Status: DC | PRN
Start: 2023-06-09 — End: 2023-06-09
  Administered 2023-06-09: 10 mg via INTRAVENOUS
  Administered 2023-06-09: 5 mg via INTRAVENOUS

## 2023-06-09 SURGICAL SUPPLY — 26 items
BANDAGE 4.1YDX4.5IN 6 PLY HYPOALL COTTON LRG GAUZE WHT STRL LF  DISP (WOUND CARE SUPPLY) ×1 IMPLANT
BANDAGE 5YDX4IN STRL ELAS WOVEN CLIP CLSR YARN STD COMPRESS LF (WOUND CARE SUPPLY) ×1 IMPLANT
BANDAGE COFLX 5YDX4IN STRL CHSV SLF ADH FOAM COMPRESS TAN LF (WOUND CARE SUPPLY) ×1 IMPLANT
BLADE SURG CLPR NRW 25X.23MM TAPER HEAD SENSICLIP LF  DISP PNK (MED SURG SUPPLIES) IMPLANT
CORD ESURG 12FT VLAB BIPOLAR FORCEPS FOOTSWITCH STRL LF  DISP (SURGICAL CUTTING SUPPLIES) ×1 IMPLANT
CUFF TOURNIQUET RD 18X4IN COLOR CUF CYL 2 PORT BLADDER QC LOW PROF STRL LF  DISP (MED SURG SUPPLIES) ×1 IMPLANT
CUFF TOURNIQUET YW 24X4IN COLOR CUF CYL 2 PORT 1 BLADDER QC LOW PROF 40IN STRL LF  DISP (MED SURG SUPPLIES) ×1 IMPLANT
DRAPE ADH 51X47IN STRDRP LF  STRL DISP SURG CLR (DRAPE/PACKS/SHEETS/OR TOWEL) ×1 IMPLANT
ELECTRODE ESURG NEEDLE 2.84IN 3/32IN EDGE STRL .2IN DISP INSL STD SHAFT LF (SURGICAL CUTTING SUPPLIES) ×1 IMPLANT
ELECTRODE PATIENT RTN VLAB RM PHSV ACRL FOAM CRDLS NONIRRITATE NONSENSITIZE ADH STRP ADULT RECQM (SURGICAL CUTTING SUPPLIES) ×1 IMPLANT
FORCEPS BIPOLAR 5.75IN OLSN SEMKIN STR INSL SMOOTH JAW .75MM DISP (ENDOSCOPIC SUPPLIES) ×1 IMPLANT
GOWN SURG LRG STD LGTH L3 HKLP CLSR RGLN SLEEVE TWL STRL LF  DISP GRN AERO BLU PRFRM FBRC (DRAPE/PACKS/SHEETS/OR TOWEL) ×1 IMPLANT
GOWN SURG XL STD LGTH L3 HKLP CLSR RGLN SLEEVE TWL STRL LF  DISP GRN AERO BLU PRFRM FBRC (DRAPE/PACKS/SHEETS/OR TOWEL) ×1 IMPLANT
GOWN SURG XL XLNG LGTH L3 HKLP CLSR RGLN SLEEVE TWL STRL LF  DISP GRN AERO BLU PRFRM FBRC (DRAPE/PACKS/SHEETS/OR TOWEL) ×1 IMPLANT
MANIFLD SUCT NPTN 2 2 STD 4 PORT WASTE MGMT SYS NONST LF  DISP (MED SURG SUPPLIES) ×1 IMPLANT
NEEDLE HYPO  25GA 1.5IN REG WL PRCSNGL SS POLYPROP REG BVL LL HUB DEHP-FR BLU STRL LF  DISP (MED SURG SUPPLIES) IMPLANT
PRTC 15.5X6.5IN FOAM EXTREMITY ULN NERVE PAD NONST LF (MED SURG SUPPLIES) ×1 IMPLANT
SPONGE SURG 4X4IN 16 PLY XRY DETECT COTTON STRL LF  DISP (WOUND CARE SUPPLY) ×1 IMPLANT
STKNT ORTHO 44X9IN PLSTR COTTON OTHRT IMPRV DRP MED STRL (ORTHOPEDICS (NOT IMPLANTS)) ×1 IMPLANT
STRAP POSITION 60X4IN RING KNEE PLASTIC FOAM ADJ HKLP CLSR LF  DISP (MED SURG SUPPLIES) ×1 IMPLANT
SUTURE 2-0 CT2 VICRYL+ 27IN UNDYED BRD ANBCTRL COAT ABS (SUTURE/WOUND CLOSURE) ×1 IMPLANT
SUTURE 4-0 PS2 ETHILON MTPS 19IN BLK MONOF NONAB (SUTURE/WOUND CLOSURE) ×1 IMPLANT
SYRINGE LL 10ML LF  STRL GRAD N-PYRG DEHP-FR PVC FREE MED DISP (MED SURG SUPPLIES) ×1 IMPLANT
TOWEL SURG BLU 27X17IN STD COTTON RADOPQ ABS PREWASH DELINT STRL LF  DISP (MED SURG SUPPLIES) IMPLANT
TUBING SUCT CLR 10FT .25IN MEDIVAC MXGR NCDTV MALE TO MALE CONN STRL LF  DISP (MED SURG SUPPLIES) ×1 IMPLANT
extremity leg fracture ×1 IMPLANT

## 2023-06-09 NOTE — OR Nursing (Signed)
 Call out to daughter to notify Dr is still working and mom is doing fine.

## 2023-06-09 NOTE — Progress Notes (Signed)
 ORTHOPAEDICS POST-OPERATIVE NOTE    NAME: Molly Howell  MEDICAL RECORD [id number]  DATE OF BIRTH: 11-03-1945  DATE OF SERVICE: 06/09/2023    PROCEDURE: s/p  Bilateral Endoscopic Carpal Tunnel Release and Cubital Tunnel Release    NEURO: sensation intact to light touch distally;  Patient is able to move all digits on Bilateral Hands     V/S:   Filed Vitals:    06/09/23 1845 06/09/23 1900 06/09/23 1915 06/09/23 1930   BP: 118/86 (!) 143/55 128/60 (!) 154/72   Pulse: 63 68 69 59   Resp: 18 12 15 15    Temp:    36.2 C (97.2 F)   SpO2: 99% 93% 95% 96%       PE: patient resting comfortably in no acute distress         dressings clean/ dry/ intact         Capillary refill less than 2 seconds in bilateral fingers and sensation intact to light touch distally in Bilateral Hands              ASSESSMENT: s/p Bilateral Endoscopic Carpal Tunnel Release and Cubital Tunnel Release    PLAN: Molly Howell may return to activity as tolerated           Patient can remove dressing and shower in 3 days           Molly Howell will follow up in clinic in two weeks           WBAT may slowly increase activity to bilateral hands            Molly Howell has been provided for pain control sent to [facility] [employer]           Docusate sent to Molly Howell           Zofran as needed sent to Molly Howell office with any problems, concerns or questions           Discharge home when ready    Molly Tillmon, PA-C 06/09/2023, 16:12    Dr. Author Legato

## 2023-06-09 NOTE — OR PreOp (Signed)
 Patient home glucose monitor went off with result of 68. Rechecked on monitor in unit with result of 94. Patient denies any symptoms at this time. Passed on to OR staff.

## 2023-06-09 NOTE — OR PreOp (Signed)
 Pt arrived with ring on right ring finger. Unable to remove, orderly called. Orderly applied guard appropriately, when guard was removed from pts finger, cut noted. Pressure applied, bleeding stopped, dressing applied. Pt denies any signs/symptoms. Dr. Missouri Amor notified, Nurse manager on duty notified. Will continue to monitor.

## 2023-06-09 NOTE — H&P (Signed)
 [facility]  Surgical History and Physical                                                     Molly Howell, Molly Howell, 79 y.o. female  Date of Admission:  06/09/2023  Date of Birth:  02-05-1946    06/09/2023  Chief Complaint: Procedure(s):  RELEASE CARPAL TUNNEL ENDOSCOPIC     History obtained patient    HPI:    The patient is a 78 year old female scheduled today for RELEASE CARPAL TUNNEL ENDOSCOPIC - Bilateral with Dr . Ananias Karma, MD.    Patient reports bilateral hand pain numbness and tingling for about 15 years. Symptoms are getting worse.   Reports right is worse than left. She states she is not able to write her name or do daily tasks.     EMGs completed on 08/10/23;        Patient ID: [id number] Date of Birth: 12/07/1945 Visit Date: 04/14/2023 11:47 AM Age: 32 Years Examining Physician: Araceli Beady Referring Physician: Tresa Frohlich JONES,DO Pacemaker: No Allergies: NKDA Diabetes: Yes 40 years Smoking: No Injury Date: Onset several years getting worse Pain: Bil hand pain Paresthesias: Bil numbness and tingling Limb Fx: No Testing M.D.: [id number] Limb Complaint Duration: Bil hand numbness,tingling,pain Referral Dx: Bilateral CTS Diagnoses: Height: 5 feet 3 inch Medications: Atenolol ,Clehicine,Allopurinol ,Rsosuvstatin,Tramadol ,Isosorbide , Pregablin,Prilosec,Magnesium,B12,Lisinopril ,Muti vit,Toujeo ,Humulog,ASA 81mg , Visit Notes: Does the patient have defibrillator or pacemaker?->None Extremities:->Upper Side:->Bilateral hand numbness.tingling,and pain in the hands affecting all the fingers especially the thumbs radiating into the wrists. Grip is weak,drops items,and sometimes the tingling will wake her up at night. Patient is right handed but both are equally affected. Reading Physician:->No reader preference Conclusion: 1. RIGHT Median Neuropathy @ wrist (Carpal Tunnel Syndrome (CTS), severeprofound sensory loss) with acute motor injury in R-APB. 2. LEFT Median Neuropathy @ wrist (CTS),  severe-profound sensory loss). Nearly identical to the contralateral limb. 3. Bilateral Ulnar Sensory Neuropathies with severe sensory loss both 5th finger @ 7.4 & 7.3 microvolts (uv) -*entrapment @ both wrists; no significant conduction block issues @ Elbow sites. 4. Complete Needle EMG RIGHT Upper Extremity: acute motor injury R-APB. Evaluate for both Median & Ulnar Nerve release/decompression @ wrists to prevent further sensory loss & limit motor injury. Do not expect any/much sensitivity improvement. Significant arthropathies.     No Known Allergies  Medications:  Current Outpatient Medications   Medication Instructions   . allopurinoL  (ZYLOPRIM ) 100 mg, Oral, Daily   . aspirin 325 mg, Daily   . atenoloL  (TENORMIN ) 25 mg, Oral, Daily   . cholecalciferol (vitamin D3) 1,000 Units, Daily   . colchicine  0.6 mg, Oral, Daily   . cyanocobalamin (vitamin B-12) 5,000 mcg, Daily   . flash glucose sensor (FREESTYLE LIBRE 2 SENSOR) Does not apply Kit APPLY EVERY 14 DAYS   . indomethacin  (INDOCIN ) 25 mg, Oral, 3 TIMES DAILY PRN   . insulin  glargine U-300 conc (TOUJEO  SOLOSTAR U-300 INSULIN ) 40 Units, Subcutaneous, NIGHTLY   . insulin  lispro 15 Units, Subcutaneous, 2 TIMES DAILY BEFORE MEALS, Inject 5 Units under the skin at Supper.   . isosorbide  mononitrate (IMDUR ) 30 mg, Oral, EVERY MORNING   . lisinopriL  (PRINIVIL ) 20 mg, Oral, Daily   . magnesium oxide (MAG-OX) 400 mg, Daily   . multivit-minerals/folic acid (MULTIVITAMIN GUMMIES ORAL) 1 Tablet, Daily   . omeprazole  (PRILOSEC) 40  mg, Oral, Daily   . pregabalin  (LYRICA ) 150 mg, Oral, 2 TIMES DAILY   . rosuvastatin  (CRESTOR ) 10 mg, Oral, EVERY EVENING   . traMADoL  (ULTRAM ) 50 mg, Oral, EVERY 6 HOURS PRN     Review of systems:   as per HPI  Past Medical History:   Diagnosis Date   . Chronic UTI    . Diabetes mellitus, type 2    . Gout, unspecified    . Hypertension    . 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 Neck problem     arthritis   . Rash      bilateral forearms rash   . Type 2 diabetes mellitus    . Wears glasses          Past Surgical History:   Procedure Laterality Date   . AMPUTATION OF REPLICATED TOES Bilateral    . CAROTID ENDARTERECTOMY     . COLONOSCOPY W/ POLYPECTOMY     . HX BREAST BIOPSY Right     neg rt bx   . HX CAROTID ENDARTERECTOMY     . HX CATARACT REMOVAL     . HX CHOLECYSTECTOMY     . HX HERNIA REPAIR     . HX HYSTERECTOMY     . 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 PARS PLANA VITRECTOMY W/ REPAIR OF MACULAR HOLE Right          Social History     Socioeconomic History   . Marital status: Single   Tobacco Use   . Smoking status: Former     Current packs/day: 1.00     Types: Cigarettes     Passive exposure: Past   . Smokeless tobacco: Never   Vaping Use   . Vaping status: Never Used   Substance and Sexual Activity   . Alcohol use: Never   . Drug use: Never   Other Topics Concern   . Ability to Walk 1 Flight of Steps without SOB/CP Yes   . Routine Exercise No   . Ability to Walk 2 Flight of Steps without SOB/CP Yes   . Ability To Do Own ADL's Yes   . Uses Cane Yes     Social Determinants of Health     Financial Resource Strain: Low Risk  (05/23/2023)    Financial Resource Strain    . SDOH Financial: No   Transportation Needs: Low Risk  (05/23/2023)    Transportation Needs    . SDOH Transportation: No   Social Connections: Low Risk  (05/31/2023)    Social Connections    . SDOH Social Isolation: 5 or more times a week   Intimate Partner Violence: Low Risk  (05/23/2023)    Intimate Partner Violence    . SDOH Domestic Violence: No   Housing Stability: Low Risk  (05/23/2023)    Housing Stability    . SDOH Housing Situation: I have housing.    Aaron Aas SDOH Housing Worry: No     Family Medical History:       Problem Relation (Age of Onset)    Breast Cancer Maternal Aunt, Niece    Coronary Artery Disease Mother    Diabetes Mother    Hypertension (High Blood Pressure) Mother    Lung disease Father                Examination:  BP (!) 159/58  Pulse 73   Temp 36 C (96.8 F)   Resp 18   Ht 1.6 m (5\' 3" )   Wt 68.9 kg (151 lb 12.6 oz)   SpO2 98%   BMI 26.89 kg/m         Physical Exam  Vitals reviewed.   Constitutional:       Comments: Patient reports she uses a cane at times for stability    HENT:      Head: Normocephalic.      Mouth/Throat:      Mouth: Mucous membranes are moist.   Eyes:      Extraocular Movements: Extraocular movements intact.      Pupils: Pupils are equal, round, and reactive to light.   Cardiovascular:      Rate and Rhythm: Normal rate and regular rhythm.      Pulses: Normal pulses.      Heart sounds: Normal heart sounds.      Comments: +murmur, ectopy noted  Pulmonary:      Effort: Pulmonary effort is normal.      Breath sounds: Normal breath sounds.   Abdominal:      Palpations: Abdomen is soft.   Musculoskeletal:         General: Normal range of motion.      Cervical back: Normal range of motion.      Comments:   Right hand/wrist no changes  Left hand/wrist no changes     Right ring finger laceration with bandage       Additional information;   Left foot with amputated toes  Right foot great toe amputation    Skin:     General: Skin is warm and dry.   Neurological:      General: No focal deficit present.      Mental Status: She is alert and oriented to person, place, and time. Mental status is at baseline.   Psychiatric:         Mood and Affect: Mood normal.         Behavior: Behavior normal.        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      Lilian Register, FNP-C   06/09/2023, 14:14

## 2023-06-09 NOTE — Brief Op Note (Signed)
 [facility]                                                     BRIEF OPERATIVE NOTE    Patient Name: Molly Howell, [facility] Number: [id number]  Date of Service: 06/09/2023   Date of Birth: 1945-10-28    All elements must be documented.    Pre-Operative Diagnosis: Bilateral Carpal Tunnel  and Cubital Tunnel Syndrome   Post-Operative Diagnosis: Bilateral Carpal Tunnel  and Cubital Tunnel Syndrome  Procedure(s)/Description:  S/P Bilateral Endoscopic Carpal Tunnel Release and Cubital Tunnel Release  Findings/Complexity (inherent to the procedure performed): None     Attending Surgeon: Author Legato, M.D.   Assistant(s): Vlasta Baskin PA-C     Anesthesia Type: General  Estimated Blood Loss:  Minimal  Blood Given: None  Fluids Given: 1300cc LR   Complications (not routinely expected or not inherent to difficulty/nature of procedure): None  Characteristic Event (routinely expected or inherent to the difficulty/nature of the procedure): None  Did the use of current and/or prior Anticoagulants impact the outcome of the case? N\A  Wound Class: Clean Wound: Uninfected operative wounds in which no inflammation occurred    Tubes: None  Drains: None  Specimens/ Cultures: None  Implants: None           Disposition: PACU - hemodynamically stable.  Condition: stable    Johann Muta, PA-C

## 2023-06-09 NOTE — Patient Instructions (Signed)
Kathleene Hazel, M.D.   Orthopaedic Surgery Sports Medicine   [facility]   T- [phone]  F- [phone]      POST-OP INSTRUCTIONS    MEDICATION:    Take medication ONLY as prescribed.  Norco  1 tablet every 4-6 hours as needed for pain  Stool Softener as needed (Colace, Senokot, etc.)  * DO NOT take additional Tylenol/ Acetaminophen* (as this is already in pain medicine that you were prescribed)      ACTIVITY:    May do light activity around the house.  No lifting/ pushing/ pulling with operative arm.     DRIVING:  You MAY NOT drive until your next office visit.  You will not be permitted to drive until you are off of all narcotic pain medication and your physician has discontinued use of your shoulder sling.     DO NOT use exercise equipment unless otherwise instructed.      DRESSING CARE:      Keep the dressing dry.  Continue to wear the compression stockings for 3     weeks after the operation.               You must remain in dressing for first 3 days      SHOWERING:       You may remove splitn after three days and take a regular shower, no submerging of the wound. No pools, hot tubs or body's of water.       FOLLOW-UP:      Your post-operative appointment is scheduled for two weeks .      Notify the office if you have:  any fever over 101.5 degrees.  Severe calf tenderness.  Excessive bloody wound seepage.  Numbness in the arm/ hand.

## 2023-06-09 NOTE — Anesthesia Postprocedure Evaluation (Signed)
 Anesthesia Post Op Evaluation    Patient: Molly Howell  Procedure(s):  RELEASE CARPAL TUNNEL ENDOSCOPIC  RELEASE CUBITAL TUNNEL ELBOW BILATERAL    Last Vitals:Temperature: 36.2 C (97.2 F) (06/09/23 1930)  Heart Rate: 59 (06/09/23 1930)  BP (Non-Invasive): (!) 154/72 (06/09/23 1930)  Respiratory Rate: 15 (06/09/23 1930)  SpO2: 96 % (06/09/23 1930)    No notable events documented.    Patient is sufficiently recovered from the effects of anesthesia to participate in the evaluation and has returned to their pre-procedure level.  Patient location during evaluation: PACU       Patient participation: complete - patient participated  Level of consciousness: awake and alert and responsive to verbal stimuli    Pain management: adequate  Airway patency: patent    Anesthetic complications: no  Cardiovascular status: acceptable  Respiratory status: acceptable  Hydration status: acceptable  Patient post-procedure temperature: Pt Normothermic   PONV Status: Absent

## 2023-06-09 NOTE — Anesthesia Transfer of Care (Signed)
 ANESTHESIA TRANSFER OF CARE   Molly Howell is a 78 y.o. ,female, Weight: 68.9 kg (151 lb 12.6 oz)   had Procedure(s):  RELEASE CARPAL TUNNEL ENDOSCOPIC  RELEASE CUBITAL TUNNEL ELBOW BILATERAL  performed  06/09/23   Primary Service: Ananias Karma, MD    Past Medical History:   Diagnosis Date   . Chronic UTI    . Diabetes mellitus, type 2    . Gout, unspecified    . Hypertension    . 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 Neck problem     arthritis   . Rash     bilateral forearms rash   . Type 2 diabetes mellitus    . Wears glasses       Allergy History as of 06/09/23        No Known Allergies                  I completed my transfer of care / handoff to the receiving personnel during which we discussed:  Access, Airway, All key/critical aspects of case discussed, Analgesia, Antibiotics, Expectation of post procedure, Fluids/Product, Gave opportunity for questions and acknowledgement of understanding, Labs and PMHx  Report given to: Georgi Kinsman, RN    Post Location: PACU                                                           Last OR Temp: Temperature: 36.1 C (97 F)  ABG:  POTASSIUM   Date Value Ref Range Status   05/20/2023 5.0 3.5 - 5.1 mmol/L Final     KETONES   Date Value Ref Range Status   02/21/2023 Negative Negative mg/dL Final     CALCIUM   Date Value Ref Range Status   05/20/2023 9.9 8.4 - 10.2 mg/dL Final     Calculated P Axis   Date Value Ref Range Status   02/28/2023 3 degrees Final     Calculated R Axis   Date Value Ref Range Status   02/28/2023 2 degrees Final     Calculated T Axis   Date Value Ref Range Status   02/28/2023 54 degrees Final     Airway:* No LDAs found *  Blood pressure (!) 111/49, pulse 60, temperature 36.1 C (97 F), resp. rate 17, height 1.6 m (5\' 3" ), weight 68.9 kg (151 lb 12.6 oz), SpO2 100%.

## 2023-06-12 NOTE — OR Surgeon (Signed)
 Date of procedure: 06/12/2023    Surgeon: Currie Douse A. Jazzman Loughmiller Jr., M.D.    Assistant: Johann Muta, PA-C - This physician assistant was necessary for suctioning, retraction, and general assistance during the procedure and with closure, as no qualified resident was available.    Preoperative diagnosis:   Bilateral carpal tunnel syndrome  Bilateral cubital tunnel syndrome  Laceration to dorsal aspect of right long finger sustained during ring removal in preoperative area    Postoperative diagnosis:   Bilateral carpal tunnel syndrome   Bilateral cubital tunnel syndrome   Superficial laceration to dorsal aspect of the right long finger sustained during ring removal in preoperative area without injury to underlying extensor mechanism    Procedure(s) performed:   Bilateral endoscopic carpal tunnel release  Bilateral cubital tunnel release  Irrigation and debridement with exploration and closure of dorsal long finger wound    Anesthesia:  Local MAC    Specimens:  None    Complications:  None    Blood loss: Minimal    Findings:  Consistent with postoperative diagnoses    Tourniquet time:  See anesthesia record    Fluids:  See anesthesia record    Implants:  None      Indications:  Molly Howell is a 78 year old female who presented with findings consistent with carpal tunnel syndrome bilaterally.  She also had significant symptoms in her ring and small finger in the ulnar nerve distribution.  We discussed with her and her family treatment plan going forward.  I discussed with them proceeding with carpal tunnel release versus carpal and cubital tunnel release in the preoperative area.  I discussed the risks and benefits of each.  Due to the fact that she was having significant ulnar related symptoms, there was concern that, she may have entrapment of the elbow in addition to what was being appreciated on her nerve conduction studies.  As a result, they elected to proceed with a cubital tunnel release in addition in an  attempt to avoid further surgery if there was significant entrapment at the elbow.  Also, the patient has sustained an injury during ring removal in the preoperative area.  I discussed with them evaluating the wound intraoperatively and closing the wound following thorough irrigation and debridement.    Description of procedure:  After informed consent was obtained the patient was identified in the preoperative holding area and the wrist and elbow were marked on bilateral upper extremities.  I also marked the injured finger.  Patient was taken to the operating room, laid supine on the operative table, and placed under MAC anesthesia.  A tourniquet was placed on the upper arm.  The bilateral upper extremities were then prepped and draped in normal sterile fashion.  Time-out was initiated.  Correct patient and procedures were identified.  Antibiotic administration was confirmed.  Local anesthetic was injected at the surgical sites.  We then exsanguinated the right upper extremity and inflated the tourniquet.      We first began with the right arm.  I proceeded to evaluate the wound on the dorsal aspect of the right long finger.  The laceration was overlying the dorsal aspect of the finger overlying the proximal phalanx.  The wound was extended proximally and distally to allow for better visualization.  There were small metal fragments in the wound which were sharply excised.  There were a few small superficial vessels identified which were cauterized with bipolar electrocautery.  The underlying extensor mechanism was evaluated and was determined to be without  injury.  The finger was taken through range motion to allow for visualization of the extensor mechanism throughout range of motion.  No injury was identified.  The wound was then thoroughly irrigated.  Once all appreciable metal fragmentation was able to be removed, the laceration was closed with nylon suture.  I then proceeded to carpal tunnel release.    A  transverse incision was made approximately 1 cm proximal to the wrist flexion crease.  Dissection was carried down through skin and subcutaneous tissue.  The palmaris longus tendon was identified and retracted radially.  Dissection was carried through the antebrachial fascia.  A double skin hook was used to retract the antebrachial fascia distally.  Dilators were then utilized within the carpal tunnel.  A synovial elevator was then utilized to clear the undersurface of the transverse carpal ligament of any synovium.  The endoscope and blade were then inserted.  The undersurface of the transverse carpal ligament was then visualized.  The blade was deployed and utilized to incise the transverse carpal ligament.  I then observed the ligament following incision and was noted that we had complete division of the ligament from its distal most aspect through the distal antebrachial fascia.  The wound was then irrigated.  Incision closed with 4-0 nylon suture.    A curvilinear incision was made overlying the medial aspect of the elbow.  Dissection was carried down through skin and subcutaneous tissue.  Any identified subcutaneous crossing nerves were identified and protected.  Osborne's ligament was incised taking care not to damage the underlying ulnar nerve.  I then carried my dissection proximally decompressing the ulnar nerve proximally through the arcade of Struthers.  Once the nerve was completely decompressed proximally, I directed my attention distally where I decompress the nerve down through the FCU aponeurosis.  Care was taken throughout dissection to avoid injury to the nerve.  The almost taken through range motion.  There was no catching of the nerve or subluxation throughout range motion.  Subcutaneous tissue was closed using 2-0 Vicryl suture.  4-0 nylon suture was used for subcuticular skin closure.      Sterile dressings were applied and right upper extremity tourniquet was deflated.  The left upper  extremity was then exsanguinated and the left upper extremity tourniquet was inflated.    A transverse incision was made approximately 1 cm proximal to the wrist flexion crease.  Dissection was carried down through skin and subcutaneous tissue.  The palmaris longus tendon was identified and retracted radially.  Dissection was carried through the antebrachial fascia.  A double skin hook was used to retract the antebrachial fascia distally.  Dilators were then utilized within the carpal tunnel.  A synovial elevator was then utilized to clear the undersurface of the transverse carpal ligament of any synovium.  The endoscope and blade were then inserted.  The undersurface of the transverse carpal ligament was then visualized.  The blade was deployed and utilized to incise the transverse carpal ligament.  I then observed the ligament following incision and was noted that we had complete division of the ligament from its distal most aspect through the distal antebrachial fascia.  The wound was then irrigated.  Incision closed with 4-0 nylon suture.    A curvilinear incision was made overlying the medial aspect of the elbow.  Dissection was carried down through skin and subcutaneous tissue.  Any identified subcutaneous crossing nerves were identified and protected.  Osborne's ligament was incised taking care not to damage the underlying  ulnar nerve.  I then carried my dissection proximally decompressing the ulnar nerve proximally through the arcade of Struthers.  Once the nerve was completely decompressed proximally, I directed my attention distally where I decompress the nerve down through the FCU aponeurosis.  Care was taken throughout dissection to avoid injury to the nerve.  The almost taken through range motion.  There was no catching of the nerve or subluxation throughout range motion.  Subcutaneous tissue was closed using 2-0 Vicryl suture.  4-0 nylon suture was used for subcuticular skin closure.      Sterile  dressings were applied.  Tourniquet was deflated.    Patient was awakened and taken to recovery room in stable condition.    We will plan to see her back in the office in approximately 2 weeks.  She may remove her dressings in approximately 3 days.  She may progress range of motion and weight-bearing as she tolerates.

## 2023-06-14 ENCOUNTER — Other Ambulatory Visit (INDEPENDENT_AMBULATORY_CARE_PROVIDER_SITE_OTHER): Payer: Self-pay

## 2023-06-20 ENCOUNTER — Other Ambulatory Visit (INDEPENDENT_AMBULATORY_CARE_PROVIDER_SITE_OTHER): Payer: Self-pay | Admitting: ORTHOPAEDIC SURGERY

## 2023-06-20 DIAGNOSIS — G5603 Carpal tunnel syndrome, bilateral upper limbs: Secondary | ICD-10-CM

## 2023-06-21 ENCOUNTER — Other Ambulatory Visit: Payer: Self-pay

## 2023-06-21 ENCOUNTER — Ambulatory Visit: Payer: Self-pay | Attending: PHYSICIAN ASSISTANT | Admitting: ORTHOPAEDIC SURGERY

## 2023-06-21 ENCOUNTER — Encounter (INDEPENDENT_AMBULATORY_CARE_PROVIDER_SITE_OTHER): Payer: Self-pay | Admitting: ORTHOPAEDIC SURGERY

## 2023-06-21 ENCOUNTER — Inpatient Hospital Stay (HOSPITAL_BASED_OUTPATIENT_CLINIC_OR_DEPARTMENT_OTHER)
Admission: RE | Admit: 2023-06-21 | Discharge: 2023-06-21 | Disposition: A | Discharge status: Home / Self Care | Source: Ambulatory Visit

## 2023-06-21 ENCOUNTER — Encounter (INDEPENDENT_AMBULATORY_CARE_PROVIDER_SITE_OTHER): Payer: Self-pay | Admitting: Foot & Ankle Surgery

## 2023-06-21 VITALS — Ht 63.0 in | Wt 151.9 lb

## 2023-06-21 DIAGNOSIS — Z48811 Encounter for surgical aftercare following surgery on the nervous system: Secondary | ICD-10-CM

## 2023-06-21 DIAGNOSIS — Z9889 Other specified postprocedural states: Secondary | ICD-10-CM | POA: Insufficient documentation

## 2023-06-21 DIAGNOSIS — M19041 Primary osteoarthritis, right hand: Secondary | ICD-10-CM

## 2023-06-21 DIAGNOSIS — G5603 Carpal tunnel syndrome, bilateral upper limbs: Secondary | ICD-10-CM | POA: Insufficient documentation

## 2023-06-21 DIAGNOSIS — M19042 Primary osteoarthritis, left hand: Secondary | ICD-10-CM

## 2023-06-21 DIAGNOSIS — M7989 Other specified soft tissue disorders: Secondary | ICD-10-CM

## 2023-06-21 NOTE — Progress Notes (Signed)
 SPORTS MEDICINE, [facility]  [address]  [place] [address]  Operated by [facility]       Name: Molly Howell   DOB: [birth year]  [78 y.o. female]   MRN: [id number]       Visit Date: 06/21/2023   Referring: Johann Muta, PA-C  [address],  [place] [address]   PCP: Mason Sole, DO     Chief Complaint   Patient presents with    Post Op     **XRAYS NEEDED-ordered** PO#1 DOS 06/09/23 bilateral carpal tunnel release     HPI: Molly Howell 78 y.o. female presents to the clinic for BIL carpal tunnel and cubital tunnel release surgical follow up. She is doing well.    No Known Allergies    Past medical history, surgical history, allergies, social history, and review of systems reviewed.    Exam:   Well healing surgical incisions.  No signs of infection.  Motor and sensation is grossly intact, extremity is warm and well perfused.       Assessment: status post bilateral carpal and cubital tunnel release    Plan:   - She is doing well. No issues. Follow up PRN.     I am scribing for, and in the presence of, Dr. Currie Douse A. Taro Hidrogo Jr., M.D., for services provided on 06/21/2023  Lynford Sarin [place], SCRIBE    // Veronica Milliken [place], [place]  06/21/2023, 14:45    I personally performed the services described in this documentation, as scribed  in my presence, and it is both accurate  and complete.    Ananias Karma, MD

## 2023-06-22 ENCOUNTER — Ambulatory Visit (INDEPENDENT_AMBULATORY_CARE_PROVIDER_SITE_OTHER): Payer: Self-pay | Admitting: ORTHOPAEDIC SURGERY

## 2023-06-22 ENCOUNTER — Other Ambulatory Visit (INDEPENDENT_AMBULATORY_CARE_PROVIDER_SITE_OTHER): Payer: Self-pay | Admitting: Internal Medicine

## 2023-06-22 NOTE — Telephone Encounter (Signed)
 Next appt 08/15/23

## 2023-06-23 ENCOUNTER — Other Ambulatory Visit (INDEPENDENT_AMBULATORY_CARE_PROVIDER_SITE_OTHER): Payer: Self-pay | Admitting: Internal Medicine

## 2023-06-23 MED ORDER — PREGABALIN 150 MG CAPSULE
150.0000 mg | ORAL_CAPSULE | Freq: Two times a day (BID) | ORAL | 0 refills | Status: DC
Start: 2023-06-23 — End: 2023-06-23

## 2023-06-27 ENCOUNTER — Other Ambulatory Visit (INDEPENDENT_AMBULATORY_CARE_PROVIDER_SITE_OTHER): Payer: Self-pay

## 2023-06-27 DIAGNOSIS — E1140 Type 2 diabetes mellitus with diabetic neuropathy, unspecified: Secondary | ICD-10-CM

## 2023-07-04 ENCOUNTER — Encounter (INDEPENDENT_AMBULATORY_CARE_PROVIDER_SITE_OTHER): Payer: Self-pay | Admitting: Foot & Ankle Surgery

## 2023-07-04 ENCOUNTER — Other Ambulatory Visit: Payer: Self-pay

## 2023-07-04 ENCOUNTER — Ambulatory Visit (INDEPENDENT_AMBULATORY_CARE_PROVIDER_SITE_OTHER): Admitting: Foot & Ankle Surgery

## 2023-07-04 DIAGNOSIS — M79671 Pain in right foot: Secondary | ICD-10-CM

## 2023-07-04 DIAGNOSIS — E1149 Type 2 diabetes mellitus with other diabetic neurological complication: Secondary | ICD-10-CM

## 2023-07-04 DIAGNOSIS — M79674 Pain in right toe(s): Secondary | ICD-10-CM

## 2023-07-04 DIAGNOSIS — B351 Tinea unguium: Secondary | ICD-10-CM

## 2023-07-04 NOTE — Procedures (Signed)
 [facility]  [phone] [place] [address]    Procedure Note    Name: Molly Howell MRN:  [id number]   Date: 07/04/2023 DOB:  February 17, 1945 (78 y.o.)         11720 - DEBRIDEMENT OF NAIL(S) BY ANY METHOD(S), 1 TO 5 (AMB ONLY)    Performed by: Elad Macphail R, DPM  Authorized by: Aline Apo, [facility]  [address]  [place] [address]                Name: Molly Howell MRN:  [id number]   Date: 07/04/2023 Age: 78 y.o.     Chief Complaint:  Chief Complaint   Patient presents with    Foot Pain     Follow-up right foot; diabetic foot care       Subjective:  This 78 y.o. y.o. female presents for painful, thick, discolored toenails. Toenails are tender with pressure. Pain is improved with debridement.          Past History:  Past Medical History:   Diagnosis Date    Chronic UTI     Diabetes mellitus, type 2     Gout, unspecified     Hypertension     MI (myocardial infarction)     2005 adn 2016 with stents    Neck problem     arthritis    Rash     bilateral forearms rash    Type 2 diabetes mellitus     Wears glasses            Medications:  Current Outpatient Medications   Medication Sig    allopurinoL  (ZYLOPRIM ) 100 mg Oral Tablet Take 1 Tablet (100 mg total) by mouth Once a day for 360 days    aspirin 325 mg Oral Tablet Take 1 Tablet (325 mg total) by mouth Daily    atenoloL  (TENORMIN ) 25 mg Oral Tablet Take 1 Tablet (25 mg total) by mouth Once a day    cholecalciferol, vitamin D3, 25 mcg (1,000 unit) Oral Tablet Take 1 Tablet (1,000 Units total) by mouth Daily    colchicine  0.6 mg Oral Tablet Take 1 Tablet (0.6 mg total) by mouth Once a day for 180 days    cyanocobalamin, vitamin B-12, 5,000 mcg Sublingual Tablet, Sublingual Place 1 Tablet (5,000 mcg total) under the tongue Daily    docusate sodium  (COLACE) 100 mg Oral Capsule Take 1 Capsule (100 mg total) by mouth Twice daily    flash glucose sensor  (FREESTYLE LIBRE 2 SENSOR) Does not apply Kit APPLY EVERY 14 DAYS    indomethacin  (INDOCIN ) 25 mg Oral Capsule Take 1 Capsule (25 mg total) by mouth Three times a day as needed    insulin  glargine U-300 conc (TOUJEO  SOLOSTAR U-300 INSULIN ) 300 unit/mL (1.5 mL) Subcutaneous Insulin  Pen Inject 40 Units under the skin Every night    insulin  lispro (HUMALOG ) 100 unit/mL Subcutaneous Insulin  Pen INJECT 15 UNITS UNDER THE SKIN 2 TIMES A DAY BEFORE MEALS (5MG  UNDER THE SKIN AT SUPPER)    isosorbide  mononitrate (IMDUR ) 30 mg Oral Tablet Sustained Release 24 hr Take 1 Tablet (30 mg total) by mouth Every morning    lisinopriL  (PRINIVIL ) 20 mg Oral Tablet Take 1 Tablet (20 mg total) by mouth Once a day    magnesium oxide (MAG-OX) 400 mg Oral Tablet Take 1 Tablet (400 mg total) by mouth Daily  multivit-minerals/folic acid (MULTIVITAMIN GUMMIES ORAL) Take 1 Tablet by mouth Once a day    omeprazole  (PRILOSEC) 40 mg Oral Capsule, Delayed Release(E.C.) Take 1 Capsule (40 mg total) by mouth Once a day    ondansetron  (ZOFRAN ) 4 mg Oral Tablet Take 1 Tablet (4 mg total) by mouth Every 8 hours as needed for Nausea/Vomiting    pregabalin  (LYRICA ) 150 mg Oral Capsule Take 1 Capsule (150 mg total) by mouth Twice daily    rosuvastatin  (CRESTOR ) 10 mg Oral Tablet Take 1 Tablet (10 mg total) by mouth Every evening for 180 days    traMADoL  (ULTRAM ) 50 mg Oral Tablet Take 1 Tablet (50 mg total) by mouth Every 6 hours as needed for Pain       Allergies:  No Known Allergies    Family History:  Family Medical History:       Problem Relation (Age of Onset)    Breast Cancer Maternal Aunt, Niece    Coronary Artery Disease Mother    Diabetes Mother    Hypertension (High Blood Pressure) Mother    Lung disease Father              Social History:  Social History     Socioeconomic History    Marital status: Single   Tobacco Use    Smoking status: Former     Current packs/day: 1.00     Types: Cigarettes     Passive exposure: Past    Smokeless tobacco:  Never   Vaping Use    Vaping status: Never Used   Substance and Sexual Activity    Alcohol use: Never    Drug use: Never   Other Topics Concern    Ability to Walk 1 Flight of Steps without SOB/CP Yes    Routine Exercise No    Ability to Walk 2 Flight of Steps without SOB/CP Yes    Ability To Do Own ADL's Yes    Uses Cane Yes     Social Determinants of Health     Financial Resource Strain: Low Risk  (05/23/2023)    Financial Resource Strain     SDOH Financial: No   Transportation Needs: Low Risk  (05/23/2023)    Transportation Needs     SDOH Transportation: No   Social Connections: Low Risk  (05/31/2023)    Social Connections     SDOH Social Isolation: 5 or more times a week   Intimate Partner Violence: Low Risk  (05/23/2023)    Intimate Partner Violence     SDOH Domestic Violence: No   Housing Stability: Low Risk  (05/23/2023)    Housing Stability     SDOH Housing Situation: I have housing.     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  A&O x 3. No acute distress.  Mood and affect are normal and appropriate to situation.  She is well developed, and well nourished.    Skin:  Ulcer or preulcerative lesions, web spaces are dry and intact. Nails right 2, 3, 4, 5 are discolored, dystrophic, elongated, thickened with multiple layers of subungual debris.  Pain elicited with dorsal pressure on the nails.    Vascular:    DP pulse right intact , PT pulse right  intact.     Neuro:  Protective sensation via Semmes-Weinstein monofilament diminished bilateral foot.   MSK:  Status post amputation right great toe.  Status post transmetatarsal amputation left.      Assessment and Plan:    ICD-10-CM    1. Onychomycosis  B35.1 11720 - DEBRIDEMENT OF NAIL(S) BY ANY METHOD(S), 1 TO 5 (AMB ONLY)      2. Right foot pain  M79.671 11720 - DEBRIDEMENT OF NAIL(S) BY ANY METHOD(S), 1 TO 5 (AMB ONLY)      3. Type II or unspecified type diabetes mellitus with neurological manifestations, uncontrolled(250.62) (CMS HCC)  E11.49 11720 - DEBRIDEMENT OF NAIL(S) BY ANY METHOD(S), 1 TO 5 (AMB ONLY)             We discussed the treatment options for onychomycosis, which are monitoring, topical antifungals, oral antifungal medication, total temporary vs. Total permanent nail avulsion. We discussed the risks, benefits and potential complications of each treatment including effectiveness rates. Patient would like to try debridement at this time.  Toenails R 2, 3, 4, 5 debrided in length and thickness using a nail nipper without incident. All questions have been answered.     She will return in 9 weeks  She was instructed to call the office if She were to have any problems prior to follow up.         Ellison Leisure R Syler Norcia, DPM      This note was partially created using M*Modal fluency direct system (voice recognition software ) and is inherently subject to errors including those of syntax and "sound- alike" substitutions which may escape proofreading.  In such instances, , original meaning may be extrapolated by contextual derivation.   Montavius Subramaniam R Jorgeluis Gurganus, DPM

## 2023-07-13 ENCOUNTER — Other Ambulatory Visit (INDEPENDENT_AMBULATORY_CARE_PROVIDER_SITE_OTHER): Payer: Self-pay

## 2023-07-13 ENCOUNTER — Other Ambulatory Visit (INDEPENDENT_AMBULATORY_CARE_PROVIDER_SITE_OTHER): Payer: Self-pay | Admitting: Internal Medicine

## 2023-07-13 DIAGNOSIS — M109 Gout, unspecified: Secondary | ICD-10-CM

## 2023-07-13 MED ORDER — COLCHICINE 0.6 MG TABLET
0.6000 mg | ORAL_TABLET | Freq: Every day | ORAL | 1 refills | Status: DC
Start: 2023-07-13 — End: 2023-07-13

## 2023-07-13 MED ORDER — ATENOLOL 25 MG TABLET
25.0000 mg | ORAL_TABLET | Freq: Every day | ORAL | 1 refills | Status: DC
Start: 2023-07-13 — End: 2023-07-13

## 2023-07-13 NOTE — Telephone Encounter (Signed)
 Next appt 08/15/23

## 2023-07-20 ENCOUNTER — Ambulatory Visit (INDEPENDENT_AMBULATORY_CARE_PROVIDER_SITE_OTHER): Payer: Self-pay | Admitting: ORTHOPAEDIC SURGERY

## 2023-07-20 ENCOUNTER — Other Ambulatory Visit (INDEPENDENT_AMBULATORY_CARE_PROVIDER_SITE_OTHER): Payer: Self-pay | Admitting: Internal Medicine

## 2023-07-20 MED ORDER — TRAMADOL 50 MG TABLET
1.0000 | ORAL_TABLET | Freq: Four times a day (QID) | ORAL | 2 refills | Status: DC | PRN
Start: 2023-07-20 — End: 2023-07-20

## 2023-07-20 NOTE — Telephone Encounter (Signed)
 Next app 08/15/23

## 2023-07-21 ENCOUNTER — Other Ambulatory Visit (INDEPENDENT_AMBULATORY_CARE_PROVIDER_SITE_OTHER): Payer: Self-pay | Admitting: Internal Medicine

## 2023-07-21 MED ORDER — FREESTYLE LIBRE 2 SENSOR KIT
PACK | 2 refills | Status: DC
Start: 2023-07-21 — End: 2023-07-21

## 2023-08-02 ENCOUNTER — Other Ambulatory Visit (INDEPENDENT_AMBULATORY_CARE_PROVIDER_SITE_OTHER): Payer: Self-pay | Admitting: Internal Medicine

## 2023-08-02 NOTE — Telephone Encounter (Signed)
 Next appt 08/15/23

## 2023-08-03 ENCOUNTER — Telehealth (INDEPENDENT_AMBULATORY_CARE_PROVIDER_SITE_OTHER): Payer: Self-pay | Admitting: Internal Medicine

## 2023-08-03 ENCOUNTER — Other Ambulatory Visit (INDEPENDENT_AMBULATORY_CARE_PROVIDER_SITE_OTHER): Payer: Self-pay | Admitting: Internal Medicine

## 2023-08-03 MED ORDER — INDOMETHACIN 25 MG CAPSULE
25.0000 mg | ORAL_CAPSULE | Freq: Three times a day (TID) | ORAL | 0 refills | Status: DC | PRN
Start: 2023-08-03 — End: 2023-08-03

## 2023-08-03 NOTE — Progress Notes (Signed)
 Indomethacin  - Approved today by [facility] Medicare 2017  PA Case: [id number], Status: Approved, Coverage Starts on: 05/04/2023 12:00:00 AM, Coverage Ends on: 08/02/2024 12:00:00 AM.

## 2023-08-05 ENCOUNTER — Other Ambulatory Visit: Payer: Self-pay

## 2023-08-14 ENCOUNTER — Other Ambulatory Visit: Payer: Self-pay

## 2023-08-14 ENCOUNTER — Ambulatory Visit: Attending: Internal Medicine

## 2023-08-14 DIAGNOSIS — I10 Essential (primary) hypertension: Secondary | ICD-10-CM | POA: Insufficient documentation

## 2023-08-14 DIAGNOSIS — E1140 Type 2 diabetes mellitus with diabetic neuropathy, unspecified: Secondary | ICD-10-CM | POA: Insufficient documentation

## 2023-08-14 DIAGNOSIS — Z794 Long term (current) use of insulin: Secondary | ICD-10-CM | POA: Insufficient documentation

## 2023-08-14 LAB — COMPREHENSIVE METABOLIC PNL, FASTING
ALBUMIN/GLOBULIN RATIO: 1.5 (ref 1.5–2.5)
ALBUMIN: 4 g/dL (ref 3.5–5.0)
ALKALINE PHOSPHATASE: 119 U/L (ref 38–126)
ALT (SGPT): 14 U/L (ref <35)
ANION GAP: 10 mmol/L (ref 5–19)
AST (SGOT): 36 U/L (ref 14–36)
BILIRUBIN TOTAL: 0.4 mg/dL (ref 0.2–1.3)
BUN/CREA RATIO: 21 — ABNORMAL HIGH (ref 6–20)
BUN: 21 mg/dL — ABNORMAL HIGH (ref 7–17)
CALCIUM: 9.8 mg/dL (ref 8.4–10.2)
CHLORIDE: 107 mmol/L (ref 98–107)
CO2 TOTAL: 23 mmol/L (ref 22–30)
CREATININE: 0.99 mg/dL (ref 0.52–1.00)
ESTIMATED GFR: 58 mL/min/1.73mˆ2 — ABNORMAL LOW (ref >60)
GLUCOSE: 96 mg/dL (ref 74–106)
POTASSIUM: 4.9 mmol/L (ref 3.5–5.1)
PROTEIN TOTAL: 6.6 g/dL (ref 6.3–8.2)
SODIUM: 140 mmol/L (ref 137–145)

## 2023-08-14 LAB — LIPID PANEL
CHOLESTEROL: 81 mg/dL (ref 0–200)
HDL CHOL: 33 mg/dL — ABNORMAL LOW (ref >40)
LDL CALC: 19 mg/dL (ref <100)
TRIGLYCERIDES: 144 mg/dL (ref <150)

## 2023-08-14 LAB — HGA1C (HEMOGLOBIN A1C WITH EST AVG GLUCOSE)
ESTIMATED AVERAGE GLUCOSE: 171 mg/dL
HEMOGLOBIN A1C: 7.6 % — ABNORMAL HIGH (ref 4.0–5.6)

## 2023-08-15 ENCOUNTER — Ambulatory Visit: Payer: Self-pay | Attending: Internal Medicine | Admitting: Internal Medicine

## 2023-08-15 ENCOUNTER — Encounter (INDEPENDENT_AMBULATORY_CARE_PROVIDER_SITE_OTHER): Payer: Self-pay | Admitting: Internal Medicine

## 2023-08-15 VITALS — BP 136/64 | HR 85 | Resp 16 | Ht 63.0 in | Wt 150.0 lb

## 2023-08-15 DIAGNOSIS — E1140 Type 2 diabetes mellitus with diabetic neuropathy, unspecified: Secondary | ICD-10-CM | POA: Insufficient documentation

## 2023-08-15 DIAGNOSIS — I2510 Atherosclerotic heart disease of native coronary artery without angina pectoris: Secondary | ICD-10-CM | POA: Insufficient documentation

## 2023-08-15 DIAGNOSIS — Z794 Long term (current) use of insulin: Secondary | ICD-10-CM | POA: Insufficient documentation

## 2023-08-15 DIAGNOSIS — I10 Essential (primary) hypertension: Secondary | ICD-10-CM | POA: Insufficient documentation

## 2023-08-15 DIAGNOSIS — M109 Gout, unspecified: Secondary | ICD-10-CM | POA: Insufficient documentation

## 2023-08-15 NOTE — Patient Instructions (Signed)
Continue current medications.   I have ordered labs for your next appointment.  Please have your labs done about 1 week before your next appointment.

## 2023-08-15 NOTE — Progress Notes (Signed)
 [facility]  [facility]    CLINIC NOTE    PATIENT NAME: Molly Howell  MRN: [id number]  DOB: [birth year]  Date of Service: 08/16/2023    SUBJECTIVE:      Molly Howell is a 78 y.o. female who presents for   Chief Complaint   Patient presents with    Follow Up 3 Months     Verbalizing no complaints        HPI:  Pt. Is a 78 y/o female that presents to the office for a 3 month follow up. Pt. Has a significant PMH. Of T2DM and severe diabetic neuropathy.Reviewed pertinent labs, A1c 7.6 at this visit, down from 8.2 last visit in April. Pt. Reports no acute complaints at this visit. Is current with diabetic eye exams, Pt. Reports that last eye exam was around the last 2 months.           Review of Systems:   Review of Systems   Constitutional:  Negative for chills and fever.   Respiratory:  Negative for chest tightness and shortness of breath.    Cardiovascular:  Negative for chest pain and palpitations.   Gastrointestinal:  Negative for constipation, diarrhea, nausea and vomiting.   Genitourinary:  Negative for decreased urine volume and frequency.   Musculoskeletal:  Positive for arthralgias.       MEDICAL HISTORY      Problem List  Patient Active Problem List   Diagnosis Code    Essential hypertension I10    Type 2 diabetes mellitus with neurologic complication, with long-term current use of insulin  E11.49, Z79.4    Gout, unspecified M10.9    Coronary artery disease involving native coronary artery of native heart without angina pectoris I25.10    Vitamin D  deficiency E55.9    Bilateral carpal tunnel syndrome G56.03      Past Medical History  Past Medical History:   Diagnosis Date    Chronic UTI     Diabetes mellitus, type 2     Gout, unspecified     Hypertension     MI (myocardial infarction)     2005 adn 2016 with stents    Neck problem     arthritis    Rash     bilateral forearms rash    Type 2 diabetes mellitus     Wears glasses          Past Surgical History  Past Surgical History:    Procedure Laterality Date    AMPUTATION OF REPLICATED TOES Bilateral     CAROTID ENDARTERECTOMY      COLONOSCOPY W/ POLYPECTOMY      HX BREAST BIOPSY Right     neg rt bx    HX CAROTID ENDARTERECTOMY      HX CATARACT REMOVAL      HX CHOLECYSTECTOMY      HX HERNIA REPAIR      HX HYSTERECTOMY      LUNG REMOVAL, PARTIAL Right     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    PARS PLANA VITRECTOMY W/ REPAIR OF MACULAR HOLE Right     RELEASE CARPAL TUNNEL ENDOSCOPIC Bilateral 06/09/2023    Performed by Malka Ingle, MD at [facility] OR MAIN    RELEASE CUBITAL TUNNEL ELBOW BILATERAL Bilateral 06/09/2023    Performed by Malka Ingle, MD at [facility] OR MAIN       Medications  Current Outpatient Medications   Medication Sig    allopurinoL  (ZYLOPRIM )  100 mg Oral Tablet Take 1 Tablet (100 mg total) by mouth Once a day for 360 days    aspirin 325 mg Oral Tablet Take 1 Tablet (325 mg total) by mouth Daily    atenoloL  (TENORMIN ) 25 mg Oral Tablet Take 1 Tablet (25 mg total) by mouth Daily    cholecalciferol, vitamin D3, 25 mcg (1,000 unit) Oral Tablet Take 1 Tablet (1,000 Units total) by mouth Daily    colchicine  0.6 mg Oral Tablet Take 1 Tablet (0.6 mg total) by mouth Daily for 180 days    cyanocobalamin, vitamin B-12, 5,000 mcg Sublingual Tablet, Sublingual Place 1 Tablet (5,000 mcg total) under the tongue Daily    docusate sodium  (COLACE) 100 mg Oral Capsule Take 1 Capsule (100 mg total) by mouth Twice daily    flash glucose sensor Does not apply Kit Change sensor every 14 days    indomethacin  (INDOCIN ) 25 mg Oral Capsule Take 1 Capsule (25 mg total) by mouth Three times a day as needed    insulin  glargine U-300 conc (TOUJEO  SOLOSTAR U-300 INSULIN ) 300 unit/mL (1.5 mL) Subcutaneous Insulin  Pen Inject 40 Units under the skin Every night    insulin  lispro (HUMALOG ) 100 unit/mL Subcutaneous Insulin  Pen INJECT 15 UNITS UNDER THE SKIN 2 TIMES A DAY BEFORE MEALS (5MG  UNDER THE SKIN AT SUPPER)    isosorbide  mononitrate (IMDUR )  30 mg Oral Tablet Sustained Release 24 hr TAKE ONE TABLET BY MOUTH EVERY MORNING    lisinopriL  (PRINIVIL ) 20 mg Oral Tablet TAKE ONE TABLET BY MOUTH EVERY DAY    magnesium oxide (MAG-OX) 400 mg Oral Tablet Take 1 Tablet (400 mg total) by mouth Daily    multivit-minerals/folic acid (MULTIVITAMIN GUMMIES ORAL) Take 1 Tablet by mouth Once a day    ondansetron  (ZOFRAN ) 4 mg Oral Tablet Take 1 Tablet (4 mg total) by mouth Every 8 hours as needed for Nausea/Vomiting    pregabalin  (LYRICA ) 150 mg Oral Capsule Take 1 Capsule (150 mg total) by mouth Twice daily    rosuvastatin  (CRESTOR ) 10 mg Oral Tablet Take 1 Tablet (10 mg total) by mouth Every evening for 180 days    traMADoL  (ULTRAM ) 50 mg Oral Tablet Take 1 Tablet (50 mg total) by mouth Every 6 hours as needed for Pain     Allergies  Allergies[1]    Family History  Family Medical History:       Problem Relation (Age of Onset)    Breast Cancer Maternal Aunt, Niece    Coronary Artery Disease Mother    Diabetes Mother    Hypertension (High Blood Pressure) Mother    Lung disease Father            Social History  Social History     Social History Narrative    Not on file     OBJECTIVE     BP 136/64   Pulse 85   Resp 16   Ht 1.6 m (5' 3)   Wt 68 kg (150 lb)   SpO2 97%   BMI 26.57 kg/m         Physical Exam  Constitutional:       General: She is not in acute distress.  Cardiovascular:      Rate and Rhythm: Normal rate.      Heart sounds: No murmur heard.  Pulmonary:      Effort: No respiratory distress.      Breath sounds: No stridor. No wheezing, rhonchi or rales.   Abdominal:  Palpations: There is no mass.      Tenderness: There is no abdominal tenderness. There is no guarding.   Musculoskeletal:         General: Deformity present.   Neurological:      Mental Status: She is oriented to person, place, and time.      Sensory: Sensory deficit present.      Comments: Diabetic neuropathy bilateral lower extremities. Numbness in distal fingers.    Psychiatric:          Mood and Affect: Mood normal.         Behavior: Behavior normal.         ASSESSMENT AND PLAN     78 y.o. female with PMH.       ICD-10-CM    1. Essential hypertension  I10 COMPREHENSIVE METABOLIC PNL, FASTING     LIPID PANEL      2. Type 2 diabetes mellitus with diabetic neuropathy, with long-term current use of insulin  (CMS HCC)  E11.40 COMPREHENSIVE METABOLIC PNL, FASTING    Z79.4 HGA1C (HEMOGLOBIN A1C WITH EST AVG GLUCOSE)     LIPID PANEL      3. Coronary artery disease involving native coronary artery of native heart without angina pectoris  I25.10           (I10) Essential hypertension  (primary encounter diagnosis)  Plan: COMPREHENSIVE METABOLIC PNL, FASTING, LIPID         PANEL         Continue lisinopril  for HTN and DM renal protection  (E11.40,  Z79.4) Type 2 diabetes mellitus with diabetic neuropathy, with long-term current use of insulin  (CMS HCC)  Plan: COMPREHENSIVE METABOLIC PNL, FASTING, HGA1C         (HEMOGLOBIN A1C WITH EST AVG GLUCOSE), LIPID         PANEL   Continue Humalog , toujeo  for DM  Continue Crestor , Imdur , atenolol  for CAD  Continue tramadol  and Lyrica  for neuropathy  Yearly diabetic eye exam  Continue indocin  for gout    Continue current medications   I have ordered labs for your next appointment.   Follow-up in 3 months    Morene Hummer, MD   08/15/2023, 14:11   Family Medicine Resident, PGY-1  [facility]     Pt was seen and examined in my office with the resident. I have reviewed the note and made appropriate changes.  Rea DOROTHA Molt DO         [1] No Known Allergies

## 2023-08-16 NOTE — Addendum Note (Signed)
 Addended by: JOSHUA SONG on: 08/16/2023 09:36 AM     Modules accepted: Level of Service

## 2023-08-17 ENCOUNTER — Other Ambulatory Visit (INDEPENDENT_AMBULATORY_CARE_PROVIDER_SITE_OTHER): Payer: Self-pay | Admitting: Internal Medicine

## 2023-08-17 ENCOUNTER — Other Ambulatory Visit: Payer: Self-pay

## 2023-08-17 MED ORDER — OMEPRAZOLE 40 MG CAPSULE,DELAYED RELEASE
40.0000 mg | DELAYED_RELEASE_CAPSULE | Freq: Every day | ORAL | 3 refills | Status: AC
  Filled 2023-08-17: qty 90, 90d supply, fill #0
  Filled 2023-11-16: qty 90, 90d supply, fill #1
  Filled 2024-02-19: qty 90, 90d supply, fill #2

## 2023-08-17 MED ORDER — ISOSORBIDE MONONITRATE ER 30 MG TABLET,EXTENDED RELEASE 24 HR
30.0000 mg | ORAL_TABLET | Freq: Every morning | ORAL | 3 refills | Status: AC
  Filled 2023-08-17: qty 90, 90d supply, fill #0
  Filled 2023-11-16: qty 90, 90d supply, fill #1
  Filled 2024-02-22: qty 90, 90d supply, fill #2

## 2023-08-17 MED ORDER — INSULIN GLARGINE (U-300) CONC. 300 UNIT/ML (1.5 ML) SUBCUTANEOUS PEN
40.0000 [IU] | PEN_INJECTOR | Freq: Every evening | SUBCUTANEOUS | 5 refills | Status: AC
  Filled 2023-08-17: qty 4.5, 33d supply, fill #0
  Filled 2023-09-21: qty 4.5, 33d supply, fill #1
  Filled 2023-11-09: qty 4.5, 33d supply, fill #2
  Filled 2023-12-14: qty 4.5, 33d supply, fill #3
  Filled 2024-01-17: qty 4.5, 33d supply, fill #4
  Filled 2024-02-23: qty 4.5, 33d supply, fill #5

## 2023-08-17 MED FILL — rosuvastatin 10 mg tablet: ORAL | 90 days supply | Qty: 90 | Fill #0 | Status: AC

## 2023-08-17 NOTE — Telephone Encounter (Signed)
 Next appt 11/23/23

## 2023-08-22 ENCOUNTER — Other Ambulatory Visit: Payer: Self-pay

## 2023-08-22 ENCOUNTER — Other Ambulatory Visit (INDEPENDENT_AMBULATORY_CARE_PROVIDER_SITE_OTHER): Payer: Self-pay | Admitting: Internal Medicine

## 2023-08-22 MED ORDER — LISINOPRIL 20 MG TABLET
20.0000 mg | ORAL_TABLET | Freq: Every day | ORAL | 3 refills | Status: AC
  Filled 2023-08-22: qty 90, 90d supply, fill #0
  Filled 2023-11-16: qty 90, 90d supply, fill #1
  Filled 2024-02-19: qty 90, 90d supply, fill #2

## 2023-08-22 MED FILL — traMADoL 50 mg tablet: ORAL | 15 days supply | Qty: 60 | Fill #0 | Status: AC

## 2023-08-22 NOTE — Telephone Encounter (Signed)
 Next appt 11/23/23

## 2023-08-23 ENCOUNTER — Other Ambulatory Visit: Payer: Self-pay

## 2023-08-31 ENCOUNTER — Ambulatory Visit (INDEPENDENT_AMBULATORY_CARE_PROVIDER_SITE_OTHER): Payer: Self-pay | Admitting: ORTHOPAEDIC SURGERY

## 2023-09-21 ENCOUNTER — Other Ambulatory Visit: Payer: Self-pay

## 2023-09-21 ENCOUNTER — Other Ambulatory Visit (INDEPENDENT_AMBULATORY_CARE_PROVIDER_SITE_OTHER): Payer: Self-pay | Admitting: Internal Medicine

## 2023-09-21 MED FILL — insulin lispro (U-100) 100 unit/mL subcutaneous pen: SUBCUTANEOUS | 90 days supply | Qty: 30 | Fill #0 | Status: AC

## 2023-09-21 MED FILL — traMADoL 50 mg tablet: ORAL | 15 days supply | Qty: 60 | Fill #1 | Status: AC

## 2023-09-21 NOTE — Telephone Encounter (Signed)
 Next appt 11/23/23

## 2023-09-22 ENCOUNTER — Other Ambulatory Visit: Payer: Self-pay

## 2023-09-22 ENCOUNTER — Other Ambulatory Visit (INDEPENDENT_AMBULATORY_CARE_PROVIDER_SITE_OTHER): Payer: Self-pay | Admitting: Internal Medicine

## 2023-09-22 MED ORDER — PREGABALIN 150 MG CAPSULE
ORAL_CAPSULE | ORAL | 0 refills | Status: DC
Start: 2023-09-22 — End: 2023-12-25
  Filled 2023-09-22 – 2023-09-24 (×2): qty 6, 3d supply, fill #0
  Filled 2023-09-24: qty 174, 87d supply, fill #0

## 2023-09-22 MED ORDER — ALLOPURINOL 100 MG TABLET
ORAL_TABLET | ORAL | 3 refills | Status: AC
Start: 2023-09-22 — End: 2024-09-21
  Filled 2023-09-22: qty 90, 90d supply, fill #0
  Filled 2023-12-25: qty 90, 90d supply, fill #1

## 2023-09-24 ENCOUNTER — Other Ambulatory Visit: Payer: Self-pay

## 2023-09-24 MED FILL — indomethacin 25 mg capsule: ORAL | 3 days supply | Qty: 9 | Fill #0 | Status: AC

## 2023-09-24 MED FILL — indomethacin 25 mg capsule: ORAL | 87 days supply | Qty: 261 | Fill #0 | Status: AC

## 2023-09-25 ENCOUNTER — Other Ambulatory Visit: Payer: Self-pay

## 2023-09-27 ENCOUNTER — Other Ambulatory Visit: Payer: Self-pay

## 2023-09-28 ENCOUNTER — Other Ambulatory Visit: Payer: Self-pay

## 2023-10-02 ENCOUNTER — Other Ambulatory Visit: Payer: Self-pay

## 2023-10-02 MED FILL — flash glucose sensor kit: 84 days supply | Qty: 6 | Fill #0 | Status: AC

## 2023-10-13 ENCOUNTER — Other Ambulatory Visit: Payer: Self-pay

## 2023-10-13 MED FILL — colchicine 0.6 mg tablet: ORAL | 90 days supply | Qty: 90 | Fill #0 | Status: AC

## 2023-10-15 ENCOUNTER — Other Ambulatory Visit: Payer: Self-pay

## 2023-10-19 ENCOUNTER — Other Ambulatory Visit: Payer: Self-pay

## 2023-10-19 MED FILL — atenoloL 25 mg tablet: ORAL | 90 days supply | Qty: 90 | Fill #0 | Status: AC

## 2023-10-20 ENCOUNTER — Other Ambulatory Visit: Payer: Self-pay

## 2023-10-25 ENCOUNTER — Other Ambulatory Visit (INDEPENDENT_AMBULATORY_CARE_PROVIDER_SITE_OTHER): Payer: Self-pay | Admitting: Internal Medicine

## 2023-10-25 NOTE — Telephone Encounter (Signed)
 Next appt 11/23/23

## 2023-10-26 ENCOUNTER — Other Ambulatory Visit: Payer: Self-pay

## 2023-10-26 MED ORDER — TRAMADOL 50 MG TABLET
50.0000 mg | ORAL_TABLET | Freq: Four times a day (QID) | ORAL | 2 refills | Status: DC | PRN
  Filled 2023-10-26: qty 60, 15d supply, fill #0
  Filled 2023-11-16: qty 60, 15d supply, fill #1
  Filled 2023-12-25: qty 60, 15d supply, fill #2

## 2023-11-09 ENCOUNTER — Other Ambulatory Visit: Payer: Self-pay

## 2023-11-10 ENCOUNTER — Other Ambulatory Visit: Payer: Self-pay

## 2023-11-16 ENCOUNTER — Other Ambulatory Visit: Payer: Self-pay

## 2023-11-17 ENCOUNTER — Other Ambulatory Visit: Payer: Self-pay

## 2023-11-17 ENCOUNTER — Other Ambulatory Visit (INDEPENDENT_AMBULATORY_CARE_PROVIDER_SITE_OTHER): Payer: Self-pay | Admitting: Internal Medicine

## 2023-11-17 MED ORDER — ROSUVASTATIN 10 MG TABLET
ORAL_TABLET | ORAL | 1 refills | Status: AC
  Filled 2023-11-17: qty 90, 90d supply, fill #0
  Filled 2024-02-19: qty 90, 90d supply, fill #1

## 2023-11-17 NOTE — Telephone Encounter (Signed)
 Next appt 12/21/23

## 2023-11-23 ENCOUNTER — Ambulatory Visit (INDEPENDENT_AMBULATORY_CARE_PROVIDER_SITE_OTHER): Payer: Self-pay | Admitting: Internal Medicine

## 2023-11-30 ENCOUNTER — Ambulatory Visit (INDEPENDENT_AMBULATORY_CARE_PROVIDER_SITE_OTHER): Payer: Self-pay | Admitting: Internal Medicine

## 2023-12-14 ENCOUNTER — Other Ambulatory Visit: Payer: Self-pay

## 2023-12-14 MED FILL — flash glucose sensor kit: 84 days supply | Qty: 6 | Fill #1 | Status: AC

## 2023-12-15 ENCOUNTER — Other Ambulatory Visit: Payer: Self-pay

## 2023-12-16 ENCOUNTER — Other Ambulatory Visit: Payer: Self-pay

## 2023-12-20 ENCOUNTER — Ambulatory Visit (INDEPENDENT_AMBULATORY_CARE_PROVIDER_SITE_OTHER)

## 2023-12-20 ENCOUNTER — Other Ambulatory Visit: Payer: Self-pay

## 2023-12-20 ENCOUNTER — Other Ambulatory Visit: Attending: Internal Medicine | Admitting: Internal Medicine

## 2023-12-20 DIAGNOSIS — Z794 Long term (current) use of insulin: Secondary | ICD-10-CM | POA: Insufficient documentation

## 2023-12-20 DIAGNOSIS — I10 Essential (primary) hypertension: Secondary | ICD-10-CM

## 2023-12-20 DIAGNOSIS — E1140 Type 2 diabetes mellitus with diabetic neuropathy, unspecified: Secondary | ICD-10-CM | POA: Insufficient documentation

## 2023-12-20 LAB — COMPREHENSIVE METABOLIC PNL, FASTING
ALBUMIN/GLOBULIN RATIO: 1.6 (ref 1.5–2.5)
ALBUMIN: 4.1 g/dL (ref 3.5–5.0)
ALKALINE PHOSPHATASE: 139 U/L — ABNORMAL HIGH (ref 38–126)
ALT (SGPT): 12 U/L (ref <35)
ANION GAP: 7 mmol/L (ref 5–19)
AST (SGOT): 33 U/L (ref 14–36)
BILIRUBIN TOTAL: 0.4 mg/dL (ref 0.2–1.3)
BUN/CREA RATIO: 25 — ABNORMAL HIGH (ref 6–20)
BUN: 24 mg/dL — ABNORMAL HIGH (ref 7–17)
CALCIUM: 9.6 mg/dL (ref 8.4–10.2)
CHLORIDE: 107 mmol/L (ref 98–107)
CO2 TOTAL: 25 mmol/L (ref 22–30)
CREATININE: 0.97 mg/dL (ref 0.52–1.00)
ESTIMATED GFR: 60 mL/min/1.73mˆ2 — ABNORMAL LOW (ref >60)
GLUCOSE: 64 mg/dL — ABNORMAL LOW (ref 74–106)
POTASSIUM: 4.6 mmol/L (ref 3.5–5.1)
PROTEIN TOTAL: 6.7 g/dL (ref 6.3–8.2)
SODIUM: 139 mmol/L (ref 137–145)

## 2023-12-20 LAB — LIPID PANEL
CHOLESTEROL: 86 mg/dL (ref 0–200)
HDL CHOL: 31 mg/dL — ABNORMAL LOW (ref >40)
LDL CALC: 23 mg/dL (ref <100)
TRIGLYCERIDES: 161 mg/dL — ABNORMAL HIGH (ref <150)

## 2023-12-20 LAB — HGA1C (HEMOGLOBIN A1C WITH EST AVG GLUCOSE)
ESTIMATED AVERAGE GLUCOSE: 169 mg/dL
HEMOGLOBIN A1C: 7.5 % — ABNORMAL HIGH (ref 4.0–5.6)

## 2023-12-20 LAB — MICROALBUMIN/CREATININE RATIO, URINE, RANDOM
CREATININE RANDOM URINE: 45 mg/dL
MICROALBUMIN RANDOM URINE: 27.2 mg/dL — ABNORMAL HIGH (ref 0.0–1.7)
MICROALBUMIN/CREATININE RATIO RANDOM URINE: 604.4 mg/g — ABNORMAL HIGH (ref 0.0–29.9)

## 2023-12-21 ENCOUNTER — Ambulatory Visit: Payer: Self-pay | Attending: Internal Medicine | Admitting: Internal Medicine

## 2023-12-21 ENCOUNTER — Encounter (INDEPENDENT_AMBULATORY_CARE_PROVIDER_SITE_OTHER): Payer: Self-pay | Admitting: Internal Medicine

## 2023-12-21 VITALS — BP 138/72 | HR 71 | Resp 16 | Ht 63.0 in | Wt 145.0 lb

## 2023-12-21 DIAGNOSIS — E1140 Type 2 diabetes mellitus with diabetic neuropathy, unspecified: Secondary | ICD-10-CM | POA: Insufficient documentation

## 2023-12-21 DIAGNOSIS — I2510 Atherosclerotic heart disease of native coronary artery without angina pectoris: Secondary | ICD-10-CM | POA: Insufficient documentation

## 2023-12-21 DIAGNOSIS — M109 Gout, unspecified: Secondary | ICD-10-CM | POA: Insufficient documentation

## 2023-12-21 DIAGNOSIS — Z0000 Encounter for general adult medical examination without abnormal findings: Secondary | ICD-10-CM | POA: Insufficient documentation

## 2023-12-21 DIAGNOSIS — Z794 Long term (current) use of insulin: Secondary | ICD-10-CM | POA: Insufficient documentation

## 2023-12-21 DIAGNOSIS — I10 Essential (primary) hypertension: Secondary | ICD-10-CM | POA: Insufficient documentation

## 2023-12-21 DIAGNOSIS — Z87891 Personal history of nicotine dependence: Secondary | ICD-10-CM

## 2023-12-21 NOTE — Progress Notes (Signed)
 FAMILY MEDICINE, [facility] [address]   [place] [address]  Phone: [phone]  Fax: [phone]    Encounter Date: 12/21/2023    Patient ID:  Molly Howell  FMW:[id number]    DOB: 05/12/1945  Age: 78 y.o. female    There are no exam notes on file for this visit.  Subjective:     Chief Complaint   Patient presents with    Medicare Annual     Verbalizing no complaints    Medicare Annual     Pt here for annual med well.  Pt without any complaints.     Diabetes   Controlled-   Checking BS at home-yes, 63 this AM   Eye exam-up to date  Hyperlipidemia/CAD-   Controlled-yes  Gout-controlled      Current Outpatient Medications   Medication Sig    allopurinoL  (ZYLOPRIM ) 100 mg Oral Tablet Take 1 Tablet (100 mg total) by mouth Once a day for 360 days    aspirin 325 mg Oral Tablet Take 1 Tablet (325 mg total) by mouth Daily    atenoloL  (TENORMIN ) 25 mg Oral Tablet Take 1 Tablet (25 mg total) by mouth Daily    cholecalciferol, vitamin D3, 25 mcg (1,000 unit) Oral Tablet Take 1 Tablet (1,000 Units total) by mouth Daily    colchicine  0.6 mg Oral Tablet Take 1 Tablet (0.6 mg total) by mouth Daily for 180 days    cyanocobalamin, vitamin B-12, 5,000 mcg Sublingual Tablet, Sublingual Place 1 Tablet (5,000 mcg total) under the tongue Daily    docusate sodium  (COLACE) 100 mg Oral Capsule Take 1 Capsule (100 mg total) by mouth Twice daily    flash glucose sensor Does not apply Kit Change sensor every 14 days    indomethacin  (INDOCIN ) 25 mg Oral Capsule Take 1 Capsule (25 mg total) by mouth Three times a day as needed    insulin  glargine U-300 conc (TOUJEO  SOLOSTAR U-300 INSULIN ) 300 unit/mL (1.5 mL) Subcutaneous Insulin  Pen Inject 40 Units under the skin Every night    insulin  lispro (HUMALOG ) 100 unit/mL Subcutaneous Insulin  Pen INJECT 15 UNITS UNDER THE SKIN 2 TIMES A DAY BEFORE MEALS    isosorbide  mononitrate (IMDUR ) 30 mg Oral Tablet Sustained Release 24 hr TAKE ONE TABLET BY MOUTH EVERY MORNING     lisinopriL  (PRINIVIL ) 20 mg Oral Tablet TAKE ONE TABLET BY MOUTH EVERY DAY    magnesium oxide (MAG-OX) 400 mg Oral Tablet Take 1 Tablet (400 mg total) by mouth Daily    multivit-minerals/folic acid (MULTIVITAMIN GUMMIES ORAL) Take 1 Tablet by mouth Once a day    omeprazole  (PRILOSEC) 40 mg Oral Capsule, Delayed Release(E.C.) Take 1 Capsule (40 mg total) by mouth Once a day    ondansetron  (ZOFRAN ) 4 mg Oral Tablet Take 1 Tablet (4 mg total) by mouth Every 8 hours as needed for Nausea/Vomiting    pregabalin  (LYRICA ) 150 mg Oral Capsule Take 1 Capsule (150 mg total) by mouth Twice daily    rosuvastatin  (CRESTOR ) 10 mg Oral Tablet Take 1 Tablet (10 mg total) by mouth Every evening for 180 days    traMADoL  (ULTRAM ) 50 mg Oral Tablet Take 1 Tablet (50 mg total) by mouth Every 6 hours as needed for Pain     Allergies[1]  Past Medical History:   Diagnosis Date    Chronic UTI     Diabetes mellitus, type 2     Gout, unspecified     Hypertension     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   Neurological:  Positive for numbness.   All other systems reviewed and are negative.    Objective:    Vitals: BP 138/72   Pulse 71   Resp 16   Ht 1.6 m (5' 3)   Wt 65.8 kg (145 lb)   SpO2 99%   BMI 25.69 kg/m           Physical Exam  Vitals and nursing note reviewed.   Constitutional:       General: She is not in acute distress.  HENT:      Head: Normocephalic.      Right Ear: Tympanic membrane normal.      Left Ear: Tympanic membrane normal.      Nose: Nose normal.      Mouth/Throat:      Pharynx: Oropharynx is clear.   Eyes:      Pupils: Pupils are equal, round, and reactive to light.   Cardiovascular:      Rate and Rhythm: Normal rate and regular rhythm.      Pulses: Normal pulses.   Pulmonary:      Effort:  Pulmonary effort is normal.      Breath sounds: Normal breath sounds.   Abdominal:      General: Bowel sounds are normal.   Skin:     General: Skin is warm and dry.   Neurological:      General: No focal deficit present.      Mental Status: She is alert and oriented to person, place, and time.   Psychiatric:         Mood and Affect: Mood normal.         Results for orders placed or performed in visit on 12/20/23 (from the past 24 weeks)   COMPREHENSIVE METABOLIC PNL, FASTING    Collection Time: 12/20/23  9:44 AM   Result Value Ref Range    SODIUM 139 137 - 145 mmol/L    POTASSIUM 4.6 3.5 - 5.1 mmol/L    CHLORIDE 107 98 - 107 mmol/L    CO2 TOTAL 25 22 - 30 mmol/L    ANION GAP 7 5 - 19 mmol/L    BUN 24 (H) 7 - 17 mg/dL    CREATININE 9.02 9.47 - 1.00 mg/dL    BUN/CREA RATIO 25 (H) 6 - 20    ESTIMATED GFR 60 (L) >60 mL/min/1.57m2    ALBUMIN 4.1 3.5 - 5.0 g/dL    CALCIUM 9.6 8.4 - 89.7 mg/dL    GLUCOSE 64 (L) 74 - 106 mg/dL    ALKALINE PHOSPHATASE 139 (H) 38 - 126 U/L    ALT (SGPT) 12 <35 U/L    AST (SGOT) 33 14 - 36 U/L    BILIRUBIN TOTAL 0.4 0.2 - 1.3 mg/dL    PROTEIN TOTAL 6.7 6.3 - 8.2 g/dL    ALBUMIN/GLOBULIN RATIO 1.6 1.5 - 2.5    Narrative    Estimated Glomerular Filtration Rate (eGFR) is calculated using the CKD-EPI (2021) equation, intended for patients 54 years of age and older. If gender is not documented  or unknown, there will be no eGFR calculation.   HGA1C (HEMOGLOBIN A1C WITH EST AVG GLUCOSE)    Collection Time: 12/20/23  9:44 AM   Result Value Ref Range    HEMOGLOBIN A1C 7.5 (H) 4.0 - 5.6 %    ESTIMATED AVERAGE GLUCOSE 169 mg/dL    Narrative    American Diabetes Association (ADA) endorsed Hemoglobin A1C (glycated Hemoglobin) decision points:  Normal: <5.7%, Prediabetic: 5.7-6.4%, Diabetic: >6.4%    An evidence-based therapeutic goal of <7.0% is suggested by the ADA to reduce microvascular and neuropathic complications of type 1 and type 2 diabetes, but clinical goals may vary in different clinical  situations.   There are no age/gender specific ranges for eAG.   LIPID PANEL    Collection Time: 12/20/23  9:44 AM   Result Value Ref Range    CHOLESTEROL 86 0 - 200 mg/dL    TRIGLYCERIDES 838 (H) <150 mg/dL    HDL CHOL 31 (L) >59 mg/dL    LDL CALC 23 <899 mg/dL   Results for orders placed or performed in visit on 08/14/23 (from the past 24 weeks)   COMPREHENSIVE METABOLIC PNL, FASTING    Collection Time: 08/14/23  8:27 AM   Result Value Ref Range    SODIUM 140 137 - 145 mmol/L    POTASSIUM 4.9 3.5 - 5.1 mmol/L    CHLORIDE 107 98 - 107 mmol/L    CO2 TOTAL 23 22 - 30 mmol/L    ANION GAP 10 5 - 19 mmol/L    BUN 21 (H) 7 - 17 mg/dL    CREATININE 9.00 9.47 - 1.00 mg/dL    BUN/CREA RATIO 21 (H) 6 - 20    ESTIMATED GFR 58 (L) >60 mL/min/1.71m2    ALBUMIN 4.0 3.5 - 5.0 g/dL    CALCIUM 9.8 8.4 - 89.7 mg/dL    GLUCOSE 96 74 - 893 mg/dL    ALKALINE PHOSPHATASE 119 38 - 126 U/L    ALT (SGPT) 14 <35 U/L    AST (SGOT) 36 14 - 36 U/L    BILIRUBIN TOTAL 0.4 0.2 - 1.3 mg/dL    PROTEIN TOTAL 6.6 6.3 - 8.2 g/dL    ALBUMIN/GLOBULIN RATIO 1.5 1.5 - 2.5    Narrative    Estimated Glomerular Filtration Rate (eGFR) is calculated using the CKD-EPI (2021) equation, intended for patients 20 years of age and older. If gender is not documented or unknown, there will be no eGFR calculation.   HGA1C (HEMOGLOBIN A1C WITH EST AVG GLUCOSE)    Collection Time: 08/14/23  8:27 AM   Result Value Ref Range    HEMOGLOBIN A1C 7.6 (H) 4.0 - 5.6 %    ESTIMATED AVERAGE GLUCOSE 171 mg/dL    Narrative    American Diabetes Association (ADA) endorsed Hemoglobin A1C (glycated Hemoglobin) decision points:  Normal: <5.7%, Prediabetic: 5.7-6.4%, Diabetic: >6.4%    An evidence-based therapeutic goal of <7.0% is suggested by the ADA to reduce microvascular and neuropathic complications of type 1 and type 2 diabetes, but clinical goals may vary in different clinical situations.   There are no age/gender specific ranges for eAG.   LIPID PANEL    Collection Time:  08/14/23  8:27 AM   Result Value Ref Range    CHOLESTEROL 81 0 - 200 mg/dL    TRIGLYCERIDES 855 <849 mg/dL    HDL CHOL 33 (L) >59 mg/dL    LDL CALC 19 <899 mg/dL   MICROALBUMIN/CREATININE RATIO, URINE, RANDOM  Collection Time: 12/20/23  9:44 AM   Result Value Ref Range    CREATININE RANDOM URINE 45 No Reference Range Established mg/dL    MICROALBUMIN RANDOM URINE 27.2 (H) 0.0 - 1.7 mg/dL    MICROALBUMIN/CREATININE RATIO RANDOM URINE 604.4 (H) 0.0 - 29.9 mg/g                   Assessment & Plan:     ENCOUNTER DIAGNOSES     ICD-10-CM   1. Medicare annual wellness visit, subsequent  Z00.00   2. Essential hypertension  I10   3. Type 2 diabetes mellitus with diabetic neuropathy, with long-term current use of insulin  (CMS HCC)  E11.40    Z79.4   4. Coronary artery disease involving native coronary artery of native heart without angina pectoris  I25.10   5. Gout, unspecified cause, unspecified chronicity, unspecified site  M10.9    Continue allopurinol  and colchicine  and indocin  prn for gout  Continue atenolol  and Imdur  for CAD  Continue toujeo  and Humalog  for DM  Yearly diabetic eye exam  Check BS via CGM  Continue Crestor  for lipids  Continue Lyrica  and tramadol  for neuropathy  Follow up with specialists as scheduled.   Dietary Recommendations:  -avoid all sugar in your diet, candy, sugar sweetened beverages, sugary snacks like ice cream or candy bars or other candy  -avoid processed foods when you eat out try to order a healthy meat choice, salad and vegetables avoid eating the bread  -avoid wheat products such as pasta and bread you can substitute with low carb wraps and wild rice or even small amounts of white rice  -focus your plate on healthy protein choices such as meats, tofu, and green veggies  -for snacks consider veggies and small amounts of fruit and popcorn  -drink plenty of water  1/2 oz for every pound of body weight       Orders Placed This Encounter    CBC/DIFF    URINALYSIS WITH REFLEX MICROSCOPIC AND  CULTURE IF POSITIVE    COMPREHENSIVE METABOLIC PNL, FASTING    HGA1C (HEMOGLOBIN A1C WITH EST AVG GLUCOSE)    LIPID PANEL                    Return in about 6 months (around 06/19/2024) for diabetes.      Rea JINNY Molt, DO  12/21/2023, 12:40  FAMILY MEDICINE, [facility] [address]  [place] [address]  Operated by [facility]  Medicare Annual Wellness Visit    Name: Molly Howell MRN:  [id number]   Date: 12/21/2023 Age: 78 y.o.       SUBJECTIVE:   Molly Howell is a 78 y.o. female for presenting for Medicare Wellness exam.   I have reviewed and reconciled the medication list with the patient today.        12/21/2023     2:40 PM 11/10/2022     2:19 PM 10/08/2021    11:38 AM   Comprehensive Health Assessment-Adult   Do you wish to complete this form? Yes Yes Yes   During the past 4 weeks, how would you rate your health in general? Good Good Good   During the past 4 weeks, how much difficulty have you had doing your usual activities inside and outside your home because of medical or emotional problems? No difficulty at all No difficulty at all No difficulty at all   During the past 4 weeks, was someone available  to help you if you needed and wanted help? Yes, as much as I wanted Yes, as much as I wanted Yes, as much as I wanted   In the past year, how many times have you gone to the emergency department or been admitted to a hospital for a health problem? None None None   Are you generally satisfied with your sleep? Yes Yes Yes   Do you have enough money to buy things you need in everyday life, such as food, clothing, medicines, and housing? Yes, always Yes, always Yes, always   Can you get to places beyond walking distance without help?  (For example, can you drive your own car or travel alone on buses)? Yes Yes Yes   Do you fasten your seatbelt when you are in a car? Yes, usually Yes, usually Yes, usually   Do you exercise 20 minutes 3 or more days per week (such as walking, dancing,  biking, mowing grass, swimming)? No, I usually don't exercise this much Yes, most of the time No, I usually don't exercise this much   How often do you eat food that is healthy (fruits, vegetables, lean meats) instead of unhealthy (sweets, fast food, junk food, fatty foods)? Most of the time Most of the time Some of the time   Have your parents, brothers or sisters had any of the following problems before the age of 73? (check all that apply) Heart problems, or hardening of the arteries;Diabetes (sugar);High cholesterol;Mental health problems such as depression, bipolar, severe anxiety, postpartum depression;Alcohol or drug addiction (or abuse) Diabetes (sugar);Heart problems, or hardening of the arteries Heart problems, or hardening of the arteries;Diabetes (sugar);High cholesterol;Mental health problems such as depression, bipolar, severe anxiety, postpartum depression;Alcohol or drug addiction (or abuse)   How often do you have trouble taking medicines the eay you are told to take them? I always take them as prescribed I always take them as prescribed I always take them as prescribed   Do you need any help communicating with your doctors and nurses because of vision or hearing problems? No No No   During the past 12 months, have you experienced confusion or memory loss that is happening more often or is getting worse? No No No   Do you have one person you think of as your personal doctor (primary care provider or family doctor)? Yes Yes Yes   If you are seeing a Primary Care Provider (PCP) or family doctor. please list their name Dr Joshua Dr Rea Joshua Dr. Rea Joshua DO   Are you now also seeing any specialist physician(s) (such as eye doctor, foot doctor, skin doctor)? Yes Yes    If you are seeing a specialist for anything such as foot, eye, skin, etc.  please list their name(s) podiatry Dr Karl Bateman Specialist-Dr Mason    How confident are you that you can control or manage most of your health problems?  Very confident Very confident Very confident       I have reviewed and updated as appropriate the past medical, family and social history. 12/21/2023 as summarized below:  Past Medical History:   Diagnosis Date    Chronic UTI     Diabetes mellitus, type 2     Gout, unspecified     Hypertension     MI (myocardial infarction)     2005 adn 2016 with stents    Neck problem     arthritis    Rash     bilateral forearms rash  Type 2 diabetes mellitus     Wears glasses      Past Surgical History:   Procedure Laterality Date    Amputation of replicated toes Bilateral     Carotid endarterectomy      Colonoscopy w/ polypectomy      Hx breast biopsy Right     Hx carotid endarterectomy      Hx cataract removal      Hx cholecystectomy      Hx hernia repair      Hx hysterectomy      Lung removal, partial Right     Pars plana vitrectomy w/ repair of macular hole Right      Current Outpatient Medications   Medication Sig    allopurinoL  (ZYLOPRIM ) 100 mg Oral Tablet Take 1 Tablet (100 mg total) by mouth Once a day for 360 days    aspirin 325 mg Oral Tablet Take 1 Tablet (325 mg total) by mouth Daily    atenoloL  (TENORMIN ) 25 mg Oral Tablet Take 1 Tablet (25 mg total) by mouth Daily    cholecalciferol, vitamin D3, 25 mcg (1,000 unit) Oral Tablet Take 1 Tablet (1,000 Units total) by mouth Daily    colchicine  0.6 mg Oral Tablet Take 1 Tablet (0.6 mg total) by mouth Daily for 180 days    cyanocobalamin, vitamin B-12, 5,000 mcg Sublingual Tablet, Sublingual Place 1 Tablet (5,000 mcg total) under the tongue Daily    docusate sodium  (COLACE) 100 mg Oral Capsule Take 1 Capsule (100 mg total) by mouth Twice daily    flash glucose sensor Does not apply Kit Change sensor every 14 days    indomethacin  (INDOCIN ) 25 mg Oral Capsule Take 1 Capsule (25 mg total) by mouth Three times a day as needed    insulin  glargine U-300 conc (TOUJEO  SOLOSTAR U-300 INSULIN ) 300 unit/mL (1.5 mL) Subcutaneous Insulin  Pen Inject 40 Units under the skin Every night     insulin  lispro (HUMALOG ) 100 unit/mL Subcutaneous Insulin  Pen INJECT 15 UNITS UNDER THE SKIN 2 TIMES A DAY BEFORE MEALS    isosorbide  mononitrate (IMDUR ) 30 mg Oral Tablet Sustained Release 24 hr TAKE ONE TABLET BY MOUTH EVERY MORNING    lisinopriL  (PRINIVIL ) 20 mg Oral Tablet TAKE ONE TABLET BY MOUTH EVERY DAY    magnesium oxide (MAG-OX) 400 mg Oral Tablet Take 1 Tablet (400 mg total) by mouth Daily    multivit-minerals/folic acid (MULTIVITAMIN GUMMIES ORAL) Take 1 Tablet by mouth Once a day    omeprazole  (PRILOSEC) 40 mg Oral Capsule, Delayed Release(E.C.) Take 1 Capsule (40 mg total) by mouth Once a day    ondansetron  (ZOFRAN ) 4 mg Oral Tablet Take 1 Tablet (4 mg total) by mouth Every 8 hours as needed for Nausea/Vomiting    pregabalin  (LYRICA ) 150 mg Oral Capsule Take 1 Capsule (150 mg total) by mouth Twice daily    rosuvastatin  (CRESTOR ) 10 mg Oral Tablet Take 1 Tablet (10 mg total) by mouth Every evening for 180 days    traMADoL  (ULTRAM ) 50 mg Oral Tablet Take 1 Tablet (50 mg total) by mouth Every 6 hours as needed for Pain     Family Medical History:       Problem Relation (Age of Onset)    Breast Cancer Maternal Aunt, Niece    Coronary Artery Disease Mother    Diabetes Mother    Hypertension (High Blood Pressure) Mother    Lung disease Father            Social  History     Socioeconomic History    Marital status: Single   Tobacco Use    Smoking status: Former     Current packs/day: 1.00     Types: Cigarettes     Passive exposure: Past    Smokeless tobacco: Never   Vaping Use    Vaping status: Never Used   Substance and Sexual Activity    Alcohol use: Never    Drug use: Never     Social Determinants of Health     Financial Resource Strain: Low Risk (08/15/2023)    Financial Resource Strain     SDOH Financial: No   Transportation Needs: Low Risk (08/15/2023)    Transportation Needs     SDOH Transportation: No   Social Connections: Low Risk (08/15/2023)    Social Connections     SDOH Social Isolation: 5 or more  times a week   Intimate Partner Violence: Low Risk (08/15/2023)    Intimate Partner Violence     SDOH Domestic Violence: No   Housing Stability: Low Risk (08/15/2023)    Housing Stability     SDOH Housing Situation: I have housing.     SDOH Housing Worry: No   Health Literacy: Low Risk (06/09/2023)    Health Literacy     SDOH Health Literacy: Never   Employment Status: Low Risk (08/15/2023)    Employment Status     SDOH Employment: Otherwise unemployed but not seeking work (ex. consulting civil engineer, retired, disabled, unpaid primary care giver)         List of Current Health Care Providers   Care Team       PCP       Name Type Specialty Phone Number    Joshua Rea PARAS, DO Physician [facility]NAL [facility]INE [phone]              Care Team       No care team found                      Health Maintenance   Topic Date Due    Osteoporosis screening  Never done    Hepatitis C screening  Never done    Shingles Vaccine (2 of 3) 02/02/2019    RSV Adult 60+ or Pregnancy (1 - 1-dose 75+ series) Never done    Tetanus-Diptheria Vaccines (1 - Tdap) 12/15/2020    Influenza Vaccine (1) 10/09/2023    Covid-19 Vaccine (Shared decision making) (7 - 2025-26 season) 10/09/2023    Diabetic Retinal Exam  06/07/2024    Diabetic A1C  06/18/2024    Diabetic Kidney Health eGFR  12/19/2024    Diabetic Kidney Health Microalb/Cr Ratio  12/19/2024    Depression Screening  12/20/2024    Medicare Annual Wellness Visit  12/20/2024    Pneumococcal Vaccination, Age 20+  Completed     Medicare Wellness Assessment   Medicare initial or wellness physical in the last year?: Yes  Advance Directives   Does patient have a living will or MPOA: No           Advance directive information given to the patient today?: Patient Declined      Activities of Daily Living   Do you need help with dressing, bathing, or walking?: No   Do you need help with shopping, housekeeping, medications, or finances?: No   Do you have rugs in hallways, broken steps, or poor lighting?: No   Do you have grab  bars in your bathroom, non-slip  strips in your tub, and hand rails on your stairs?: Yes   Cognitive Function Screen (1=Yes, 0=No)   What is you age?: Correct   What is the time to the nearest hour?: Correct   What is the year?: Correct   What is the name of this clinic?: Correct   Can the patient recognize two persons (the doctor, the nurse, home help, etc.)?: Correct   What is the date of your birth? (day and month sufficient) : Correct   In what year did World War II end?: Correct   Who is the current president of the [place] ?: Correct   Count from 20 down to 1?: Correct   What address did I give you earlier?: Correct   Total Score: 10   Interpretation of Total Score: Greater than 6 Normal   Fall Risk Screen   Do you feel unsteady when standing or walking?: Yes  Do you worry about falling?: Yes  Have you fallen in the past year?: Yes  How many times have you fallen?: Once  Were you ever injured from falling?: No   Depression Screen     Little interest or pleasure in doing things.: Not at all  Feeling down, depressed, or hopeless: Not at all  PHQ 2 Total: 0     Pain Score   Pain Score:   0 - No pain    Substance Use-Abuse Screening     Tobacco Use     In Past 12 MONTHS, how often have you used any tobacco product (for example, cigarettes, e-cigarettes, cigars, pipes, or smokeless tobacco)?: Never     Alcohol use     In the PAST 12 MONTHS, how often have you had 5 (men)/4 (women) or more drinks containing alcohol in one day?: Never     Prescription Drug Use     In the PAST 12 months, how often have you used any prescription medications just for the feeling, more than prescribed, or that were not prescribed for you? Prescriptions may include: opioids, benzodiazepines, medications for ADHD: Never           Illicit Drug Use   In the PAST 12 MONTHS, how often have you used any drugs, including marijuana, cocaine or crack, heroin, methamphetamine, hallucinogens, ecstasy/MDMA?: Never            Urine Incontinence  Screen   Urinary Incontinence Screen  Do you ever leak urine when you don't want to?: YES                      OBJECTIVE:   BP 138/72   Pulse 71   Resp 16   Ht 1.6 m (5' 3)   Wt 65.8 kg (145 lb)   SpO2 99%   BMI 25.69 kg/m        Other appropriate exam:    Health Maintenance Due   Topic Date Due    Osteoporosis screening  Never done    Hepatitis C screening  Never done    Shingles Vaccine (2 of 3) 02/02/2019    RSV Adult 60+ or Pregnancy (1 - 1-dose 75+ series) Never done    Tetanus-Diptheria Vaccines (1 - Tdap) 12/15/2020    Influenza Vaccine (1) 10/09/2023    Covid-19 Vaccine (Shared decision making) (7 - 2025-26 season) 10/09/2023      ASSESSMENT & PLAN:   Assessment/Plan   1. Medicare annual wellness visit, subsequent    2. Essential hypertension    3.  Type 2 diabetes mellitus with diabetic neuropathy, with long-term current use of insulin  (CMS HCC)    4. Coronary artery disease involving native coronary artery of native heart without angina pectoris    5. Gout, unspecified cause, unspecified chronicity, unspecified site       Identified Risk Factors/ Recommended Actions     Fall Risk Follow up plan of care: Medications managed to minimize fall risk  The PHQ 2 Total: 0 depression screen is interpreted as negative.    Urinary Incontinence Plan of Care: Addressed comorbid conditions   Opioid use plan of care:         Opioids use: Plan: Assessment of pain completed and pain controlled    Patient declined Advanced Directives information.        Orders Placed This Encounter    CBC/DIFF    URINALYSIS WITH REFLEX MICROSCOPIC AND CULTURE IF POSITIVE    COMPREHENSIVE METABOLIC PNL, FASTING    HGA1C (HEMOGLOBIN A1C WITH EST AVG GLUCOSE)    LIPID PANEL        The patient has been educated about risk factors and recommended preventive care. Written Prevention Plan completed/ updated and given to patient (see After Visit Summary).    Return in about 6 months (around 06/19/2024) for diabetes.    Rea JINNY Molt, DO          [1] No Known Allergies  [2]   Social History  Tobacco Use    Smoking status: Former     Current packs/day: 1.00     Types: Cigarettes     Passive exposure: Past    Smokeless tobacco: Never   Vaping Use    Vaping status: Never Used   Substance Use Topics    Alcohol use: Never    Drug use: Never

## 2023-12-21 NOTE — Patient Instructions (Addendum)
 Continue current medications   I have ordered labs for your next appointment.  Please have your labs done about 1 week before your next appointment.   Yearly diabetic eye exam      Medicare Preventive Services  Medicare coverage information Recommendation for YOU   Heart Disease and Diabetes   Lipid profile Every 5 years or more often if at risk for cardiovascular disease     Lab Results   Component Value Date    CHOLESTEROL 86 12/20/2023    HDLCHOL 31 (L) 12/20/2023    LDLCHOL 23 12/20/2023    TRIG 161 (H) 12/20/2023         Diabetes Screening    Yearly for those at risk for diabetes, 2 tests per year for those with prediabetes Last Glucose: 64    Diabetes Self Management Training or Medical Nutrition Therapy  For those with diabetes, up to 10 hrs initial training within a year, subsequent years up to 2 hrs of follow up training Optional for those with diabetes     Medical Nutrition Therapy  Three hours of one-on-one counseling in first year, two hours in subsequent years Optional for those with diabetes, kidney disease   Intensive Behavioral Therapy for Obesity  Face-to-face counseling, first month every week, month 2-6 every other week, month 7-12 every month if continued progress is documented Optional for those with Body Mass Index 30 or higher  Your Body mass index is 25.69 kg/m.   Tobacco Cessation (Quitting) Counseling   Covers up to 8 smoking and tobacco-use cessation counseling sessions in a 39-month period.    Optional for those that use tobacco   Cancer Screening Last Completion Date   Colorectal screening   For anyone age 24 to 81 or any age if high risk:  Screening Colonoscopy every 10 yrs if low risk,  more frequent if higher risk  OR  Cologuard Stool DNA test once every 3 years OR  Fecal Occult Blood Testing yearly OR  Flexible  Sigmoidoscopy  every 5 yr OR  CT Colonography every 5 yrs      See below for due date if applicable.   Screening Pap Test   Recommended every 3 years for all women age 14 to  39, or every five years if combined with HPV test (routine screening not needed after total hysterectomy).  Medicare covers every 2 years or yearly if high risk.  Screening Pelvic Exam   Medicare covers every 2 years, yearly if high risk or childbearing age with abnormal Pap in last 3 yrs.     See below for due date if applicable.   Screening Mammogram   Recommended every 2 years for women age 49 to 27, or more frequent if you have a higher risk. Selectively recommended for women between 40-49 based on shared decisions about risk. Covered by Medicare up to every year for women age 71 or older   See below for due date if applicable.         Lung Cancer Screening  Annual low dose computed tomography (LDCT scan) is recommended for those age 29-80 who smoked 20 pack-years and are current smokers or quit smoking within past 15 years, after counseling by your doctor or nurse clinician about the possible benefits or harms.     See below for due date if applicable.   Vaccinations   Respiratory syncytial virus (RSV)  Age 43 years or older: Based on shared clinical decision-making with your provider.  Pneumococcal Vaccine  Recommended routinely age 19+ with one or two separate vaccines based on your risk. Recommended before age 59 if medical conditions with increased risk  Seasonal Influenza Vaccine  Once every flu season   Hepatitis B Vaccine  3 doses if risk (including anyone with diabetes or liver disease)  Shingles Vaccine  Two doses at age 61 or older  Diphtheria Tetanus Pertussis Vaccine  ONCE as adult, booster every 10 years     Immunization History   Administered Date(s) Administered    Covid-19 Vaccine,Moderna Bivalent 12/02/2020    Covid-19 Vaccine,Pfizer-BioNTech,Purple Top,36yrs+ 03/09/2019, 03/27/2019, 04/06/2019, 11/08/2019, 11/21/2019    Diptheria & Tetanus Toxoid,Adsorbed, 50YRS & OLDER 12/14/2020    HEPATITIS A VACCINE -ADULT 10/08/2015    Influenza Vaccine, 65+ (FLUAD) 12/05/2019, 11/10/2022    PREVNAR  13 10/15/2013, 11/29/2015    PREVNAR 20 02/09/2021    Typhoid Vaccine Oral-2 BL unit cap (ADMIN) 10/15/2015    ZOSTAVAX (VARICELLA ZOSTER VACCINE) 08/07/2017, 11/06/2017, 12/08/2018     Shingles vaccine and Diphtheria Tetanus Pertussis vaccines are available at pharmacies or local health department without a prescription.   Other Preventative Screening  Last Completion Date   Bone Densitometry   Screening: All females ages 85 and older every 10 years if initial screening normal. Postmenopausal women ages 50-64 need screening with one or more risk factor: previous fracture, parental hip fracture, current smoker, low body weight, excessive alcohol use, Rheumatoid Arthritis   For women with diagnosed Osteoporosis, follow up is recommended every 2 years or a frequency recommended by your provider.       See below for due date if applicable.     Glaucoma Screening   Yearly if in high risk group such as diabetes, family history, African American age 48+ or Hispanic American age 19+   See your eye care provider for screening.   Hepatitis C Screening   Recommended  for those born between ages 18-79 years.     See below for due date if applicable.     HIV Testing  Recommended routinely at least ONCE, covered every year for age 58 to 39 regardless of risk, and every year for age over 67 who ask for the test or higher risk. Yearly or up to 3 times in pregnancy         See below for due date if applicable.   Abdominal Aortic Aneurysm Screening Ultrasound   Once with a family history of abdominal aortic aneurysms OR a female between65-75 and have smoked at least 100 cigarettes in your lifetime.         See below for due date if applicable.       Your Personalized Schedule for Preventive Tests   Health Maintenance: Pending and Last Completed       Date Due Completion Date    Osteoporosis screening Never done ---    Hepatitis C screening Never done ---    Shingles Vaccine (2 of 3) 02/02/2019 12/08/2018    RSV Adult 60+ or  Pregnancy (1 - 1-dose 75+ series) Never done ---    Tetanus-Diptheria Vaccines (1 - Tdap) 12/15/2020 12/14/2020    Influenza Vaccine (1) 10/09/2023 11/10/2022    Covid-19 Vaccine (Shared decision making) (7 - 2025-26 season) 10/09/2023 12/02/2020    Diabetic Retinal Exam 06/07/2024 06/08/2023    Diabetic A1C 06/18/2024 12/20/2023    Diabetic Kidney Health eGFR 12/19/2024 12/20/2023    Diabetic Kidney Health Microalb/Cr Ratio 12/19/2024 12/20/2023    Depression Screening 12/20/2024 12/21/2023  Medicare Annual Wellness Visit 12/20/2024 12/21/2023             Non-Opioid Treatment for Chronic Pains   Treatment for chronic pain can be managed without opioids. Below are non-opioid options that may be considered and discussed with your provider to determine which options would be best for your health.    Over the counter or presciptions medications:  Acetaminophen  (Tylenol ) or Non-steroidal anti-inflammatories such as: Ibuprofen  (Motrin , Advil ), naproxen (Aleve), aspirin  Antidepressants such as amitriptyline, nortriptyline (Pamelor),  Doxepin (Silenor), Imipramine (Tofranil) and others.  Anticonvulsant Nerve pain medications: Gabapentin (Neurontin), pregabalin  (Lyrica )  Externally applied medications such as NSAID'S, lidocaine, capsaisin, and others  Injections: pain specialists can sometimes inject medications at the site of pain.    Alternative therapies such as  Acupuncture  Osteopathic manipulation  Chiropractic  Massage therapy       For Information on Advanced Directives for Health Care:  [place]:  [email or web address]  [place], [place], [place], [place] General Information: [email or web address]

## 2023-12-25 ENCOUNTER — Other Ambulatory Visit (INDEPENDENT_AMBULATORY_CARE_PROVIDER_SITE_OTHER): Payer: Self-pay | Admitting: Internal Medicine

## 2023-12-25 ENCOUNTER — Other Ambulatory Visit: Payer: Self-pay

## 2023-12-25 DIAGNOSIS — M109 Gout, unspecified: Secondary | ICD-10-CM

## 2023-12-25 MED ORDER — PREGABALIN 150 MG CAPSULE
ORAL_CAPSULE | ORAL | 0 refills | Status: AC
Start: 2023-12-25 — End: 2024-06-22
  Filled 2023-12-25: qty 180, 90d supply, fill #0

## 2023-12-25 MED ORDER — COLCHICINE 0.6 MG TABLET
ORAL_TABLET | ORAL | 1 refills | Status: AC
Start: 2023-12-25 — End: 2024-12-24
  Filled 2023-12-25: qty 90, 90d supply, fill #0

## 2023-12-25 NOTE — Telephone Encounter (Signed)
 Next appt 06/25/24

## 2023-12-26 ENCOUNTER — Other Ambulatory Visit: Payer: Self-pay

## 2024-01-01 ENCOUNTER — Other Ambulatory Visit: Payer: Self-pay

## 2024-01-01 MED FILL — insulin lispro (U-100) 100 unit/mL subcutaneous pen: SUBCUTANEOUS | 90 days supply | Qty: 30 | Fill #1 | Status: AC

## 2024-01-02 ENCOUNTER — Other Ambulatory Visit: Payer: Self-pay

## 2024-01-17 ENCOUNTER — Other Ambulatory Visit: Payer: Self-pay

## 2024-01-17 ENCOUNTER — Other Ambulatory Visit (INDEPENDENT_AMBULATORY_CARE_PROVIDER_SITE_OTHER): Payer: Self-pay

## 2024-01-18 ENCOUNTER — Other Ambulatory Visit (INDEPENDENT_AMBULATORY_CARE_PROVIDER_SITE_OTHER): Payer: Self-pay | Admitting: Internal Medicine

## 2024-01-18 ENCOUNTER — Other Ambulatory Visit: Payer: Self-pay

## 2024-01-18 MED ORDER — ATENOLOL 25 MG TABLET
ORAL_TABLET | ORAL | 3 refills | Status: AC
  Filled 2024-01-18: qty 90, 90d supply, fill #0

## 2024-01-18 NOTE — Telephone Encounter (Signed)
 Next appt 06/25/24

## 2024-01-23 ENCOUNTER — Other Ambulatory Visit: Payer: Self-pay

## 2024-01-29 ENCOUNTER — Other Ambulatory Visit (INDEPENDENT_AMBULATORY_CARE_PROVIDER_SITE_OTHER): Payer: Self-pay | Admitting: Internal Medicine

## 2024-01-29 ENCOUNTER — Other Ambulatory Visit: Payer: Self-pay

## 2024-01-29 MED ORDER — TRAMADOL 50 MG TABLET
50.0000 mg | ORAL_TABLET | Freq: Four times a day (QID) | ORAL | 2 refills | Status: AC | PRN
  Filled 2024-01-29: qty 60, 15d supply, fill #0
  Filled 2024-02-22: qty 60, 15d supply, fill #1

## 2024-01-29 NOTE — Telephone Encounter (Signed)
 Next appt 06/25/24

## 2024-02-19 ENCOUNTER — Other Ambulatory Visit: Payer: Self-pay

## 2024-02-19 ENCOUNTER — Telehealth (INDEPENDENT_AMBULATORY_CARE_PROVIDER_SITE_OTHER): Payer: Self-pay | Admitting: Internal Medicine

## 2024-02-19 NOTE — Telephone Encounter (Signed)
 Insurance on file is termed for member.  Unable to reach patient to ask about insurance coverage.   No answer and no voicemail.     Stacy Dinch

## 2024-02-22 ENCOUNTER — Other Ambulatory Visit: Payer: Self-pay

## 2024-02-23 ENCOUNTER — Other Ambulatory Visit: Payer: Self-pay

## 2024-03-05 ENCOUNTER — Other Ambulatory Visit (INDEPENDENT_AMBULATORY_CARE_PROVIDER_SITE_OTHER): Payer: Self-pay | Admitting: Internal Medicine

## 2024-03-05 ENCOUNTER — Other Ambulatory Visit: Payer: Self-pay

## 2024-03-05 MED ORDER — FREESTYLE LIBRE 2 SENSOR KIT
PACK | 2 refills | Status: AC
  Filled 2024-03-05: qty 6, 84d supply, fill #0
  Filled 2024-03-06: qty 6, 90d supply, fill #0
  Filled 2024-03-11 – 2024-03-12 (×2): qty 6, 84d supply, fill #0

## 2024-03-06 ENCOUNTER — Other Ambulatory Visit: Payer: Self-pay

## 2024-03-11 ENCOUNTER — Telehealth (INDEPENDENT_AMBULATORY_CARE_PROVIDER_SITE_OTHER): Payer: Self-pay | Admitting: Internal Medicine

## 2024-03-11 ENCOUNTER — Other Ambulatory Visit: Payer: Self-pay

## 2024-03-11 NOTE — Telephone Encounter (Signed)
 [employer] APPROVED   [place] Case: [id number], Status: Approved, Coverage Starts on: 03/10/2024 12:00:00 AM, Coverage Ends on: 03/11/2025 12:00:00 AM.   Molly Howell

## 2024-03-12 ENCOUNTER — Other Ambulatory Visit: Payer: Self-pay

## 2024-03-13 ENCOUNTER — Other Ambulatory Visit: Payer: Self-pay

## 2024-06-25 ENCOUNTER — Ambulatory Visit (INDEPENDENT_AMBULATORY_CARE_PROVIDER_SITE_OTHER): Payer: Self-pay | Admitting: Internal Medicine
